# Patient Record
Sex: Female | Born: 1935 | Race: White | Hispanic: No | Marital: Married | Smoking: Never smoker
Health system: Southern US, Community
[De-identification: ages and names within clinical notes are randomized; demographics above are authoritative.]

## PROBLEM LIST (undated history)

## (undated) DIAGNOSIS — F32A Depression, unspecified: Secondary | ICD-10-CM

## (undated) DIAGNOSIS — T7840XA Allergy, unspecified, initial encounter: Secondary | ICD-10-CM

## (undated) DIAGNOSIS — R011 Cardiac murmur, unspecified: Secondary | ICD-10-CM

## (undated) DIAGNOSIS — G459 Transient cerebral ischemic attack, unspecified: Secondary | ICD-10-CM

## (undated) DIAGNOSIS — M069 Rheumatoid arthritis, unspecified: Secondary | ICD-10-CM

## (undated) DIAGNOSIS — I10 Essential (primary) hypertension: Secondary | ICD-10-CM

## (undated) DIAGNOSIS — F329 Major depressive disorder, single episode, unspecified: Secondary | ICD-10-CM

## (undated) DIAGNOSIS — I739 Peripheral vascular disease, unspecified: Secondary | ICD-10-CM

## (undated) DIAGNOSIS — D649 Anemia, unspecified: Secondary | ICD-10-CM

## (undated) HISTORY — DX: Cardiac murmur, unspecified: R01.1

## (undated) HISTORY — DX: Major depressive disorder, single episode, unspecified: F32.9

## (undated) HISTORY — PX: AMPUTATION TOE: SHX6595

## (undated) HISTORY — DX: Allergy, unspecified, initial encounter: T78.40XA

## (undated) HISTORY — PX: OTHER SURGICAL HISTORY: SHX169

## (undated) HISTORY — DX: Essential (primary) hypertension: I10

## (undated) HISTORY — PX: TOTAL KNEE ARTHROPLASTY: SHX125

## (undated) HISTORY — PX: TONSILLECTOMY: SUR1361

## (undated) HISTORY — DX: Depression, unspecified: F32.A

## (undated) HISTORY — DX: Anemia, unspecified: D64.9

---

## 2009-08-07 ENCOUNTER — Emergency Department (HOSPITAL_COMMUNITY): Admission: EM | Admit: 2009-08-07 | Discharge: 2009-08-07 | Payer: Self-pay | Admitting: Emergency Medicine

## 2011-01-22 LAB — DIFFERENTIAL
Eosinophils Absolute: 0 10*3/uL (ref 0.0–0.7)
Monocytes Absolute: 0.6 10*3/uL (ref 0.1–1.0)

## 2011-01-22 LAB — URINALYSIS, ROUTINE W REFLEX MICROSCOPIC
Bilirubin Urine: NEGATIVE
Glucose, UA: NEGATIVE mg/dL
Hgb urine dipstick: NEGATIVE
Ketones, ur: NEGATIVE mg/dL
Nitrite: NEGATIVE
Urobilinogen, UA: 1 mg/dL (ref 0.0–1.0)
pH: 7.5 (ref 5.0–8.0)

## 2011-01-22 LAB — BASIC METABOLIC PANEL
BUN: 9 mg/dL (ref 6–23)
CO2: 25 mEq/L (ref 19–32)
Calcium: 8.8 mg/dL (ref 8.4–10.5)
Chloride: 97 mEq/L (ref 96–112)
Creatinine, Ser: 0.71 mg/dL (ref 0.4–1.2)
GFR calc Af Amer: >60 mL/min (ref >60)
GFR calc non Af Amer: >60 mL/min (ref >60)

## 2011-01-22 LAB — CBC
Hemoglobin: 13.5 g/dL (ref 12.0–15.0)
Platelets: 278 10*3/uL (ref 150–400)
RDW: 15.1 % (ref 11.5–15.5)

## 2011-01-22 LAB — URINE MICROSCOPIC-ADD ON

## 2011-01-22 LAB — SEDIMENTATION RATE: Sed Rate: 11 mm/hr (ref 0–22)

## 2011-01-22 LAB — PROTIME-INR: INR: 0.94 (ref 0.00–1.49)

## 2011-04-11 IMAGING — CR DG CHEST 2V
2 series · 2 of 2 positions shown · non-contrast
Comparison: None

CLINICAL DATA: Multiple complaints no known injury

CHEST - 2 VIEW

[w chest pa]
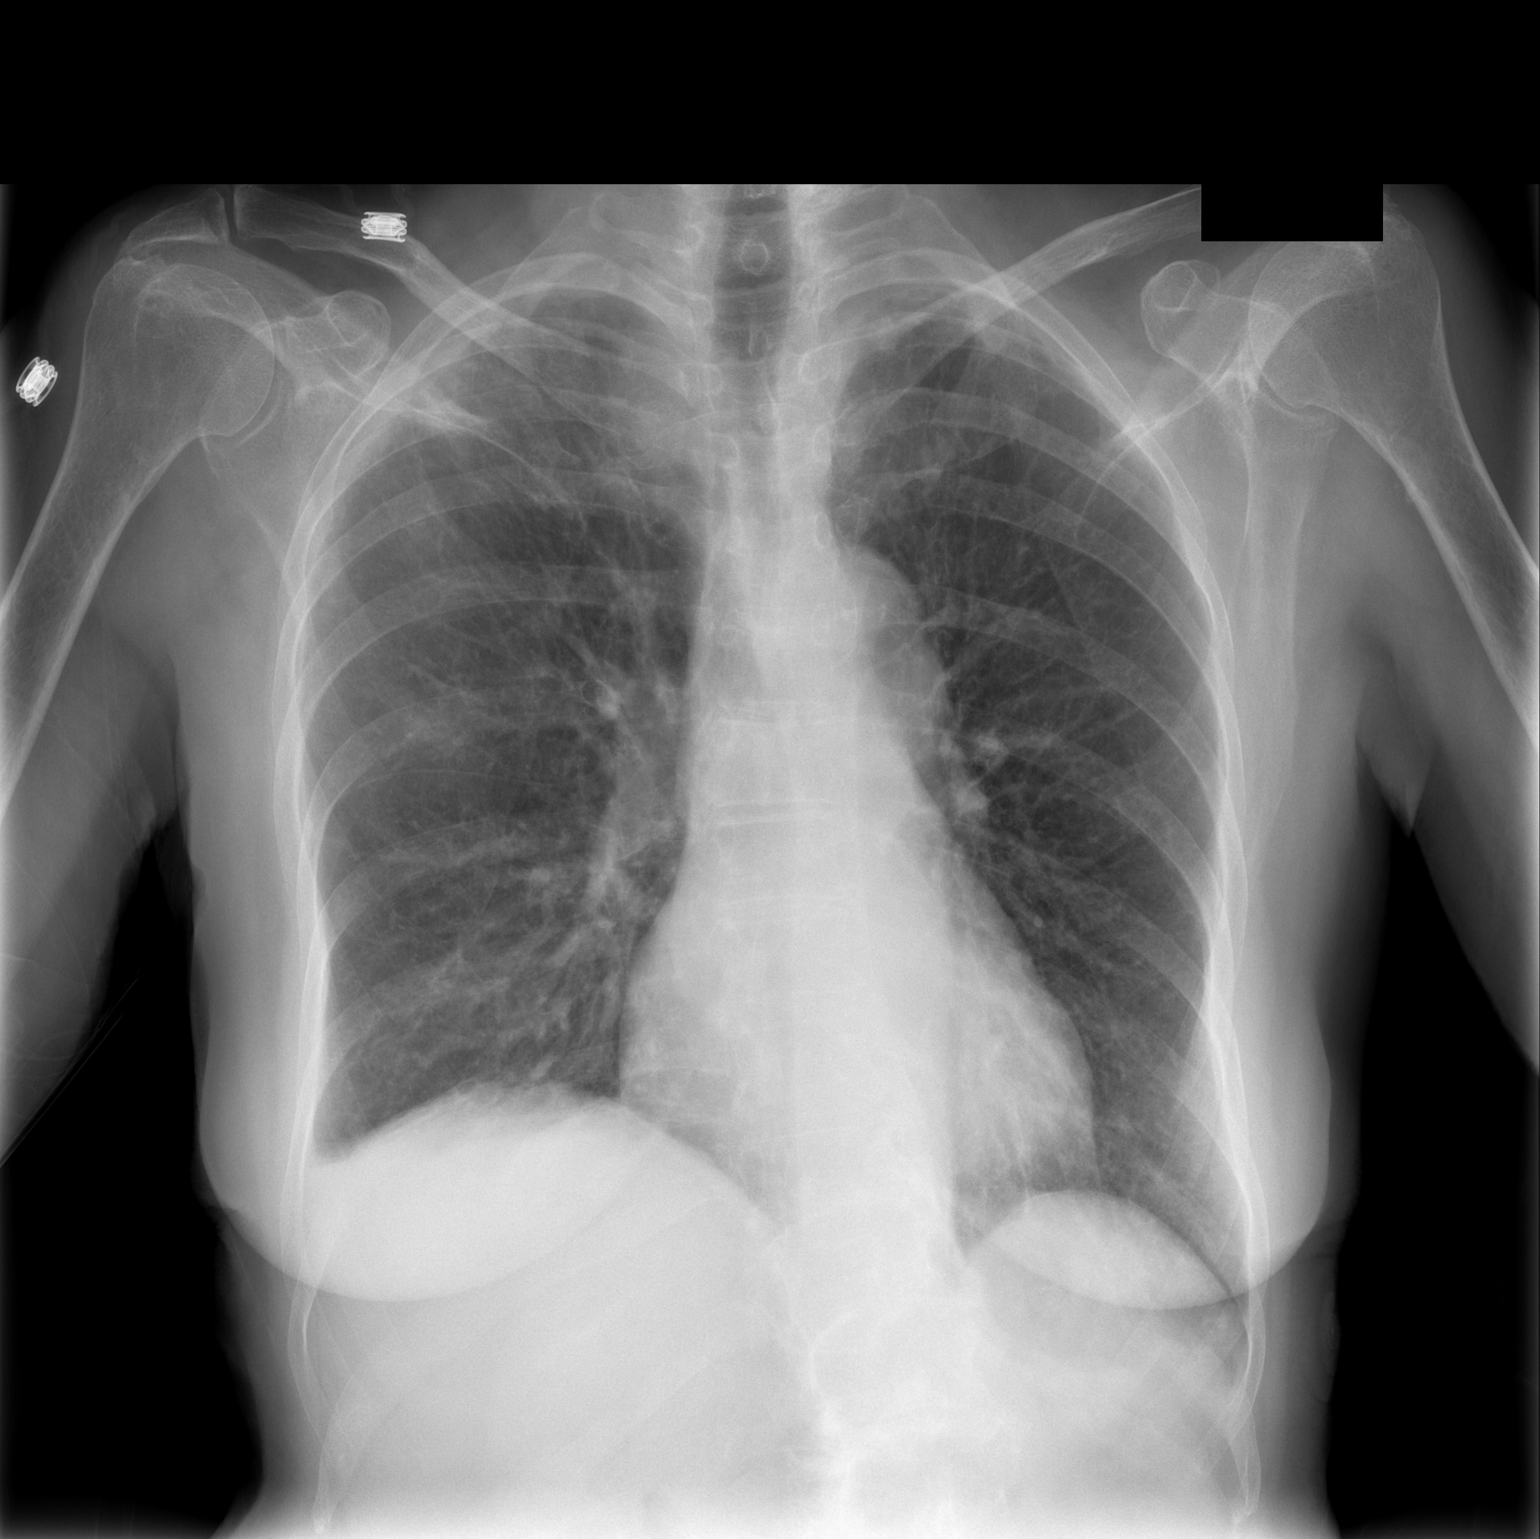

[w chest lat]
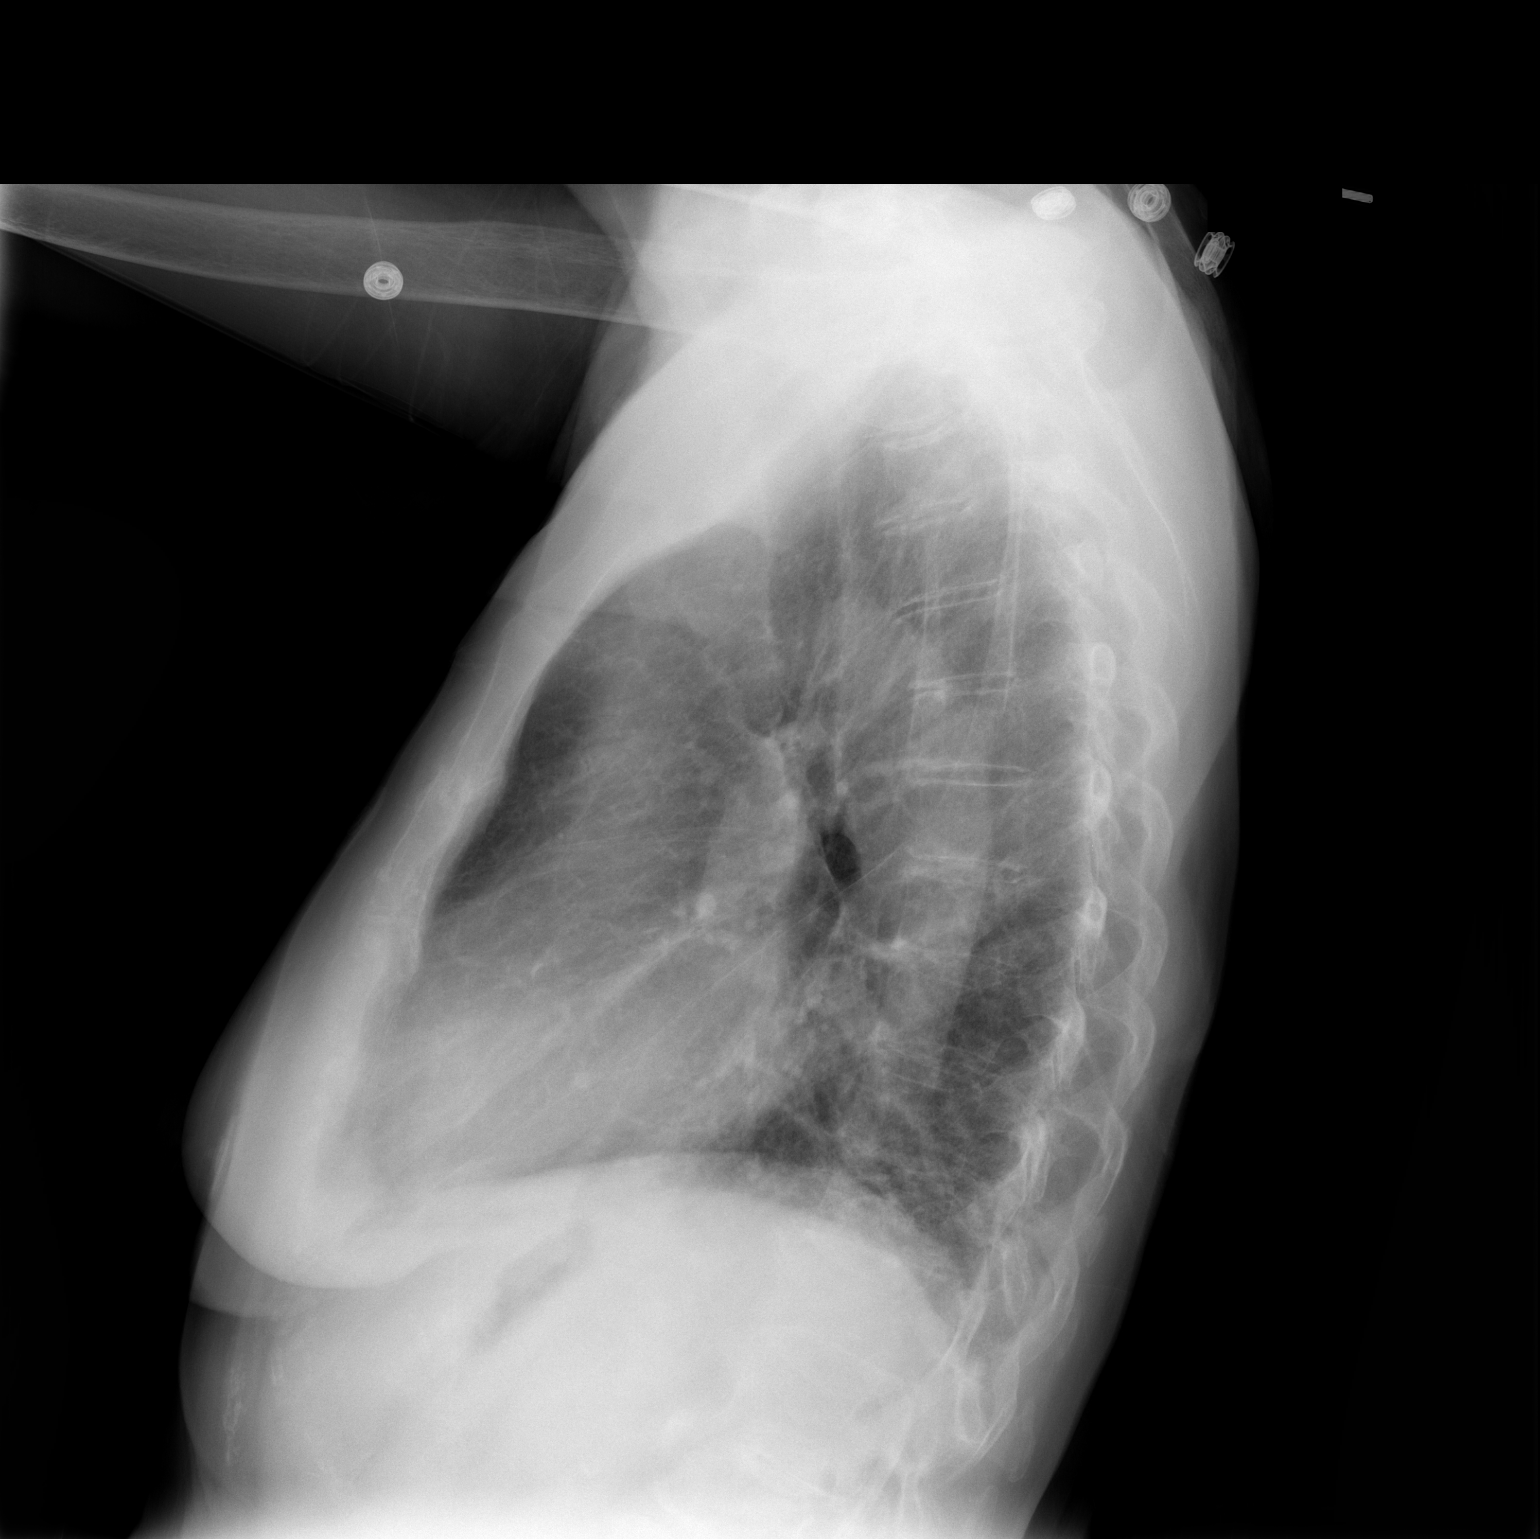

[2 of 2 positions shown; findings below may reference images not displayed]

FINDINGS: Cardiomediastinal silhouette is unremarkable.  Mild
hyperinflation.  No acute infiltrate or pleural effusion.  No
pulmonary edema.  Mild degenerative changes thoracic spine.
IMPRESSION: No active disease.  Mild hyperinflation.

## 2016-06-01 ENCOUNTER — Ambulatory Visit (INDEPENDENT_AMBULATORY_CARE_PROVIDER_SITE_OTHER): Payer: Medicare Other | Admitting: Podiatry

## 2016-06-01 DIAGNOSIS — M79675 Pain in left toe(s): Secondary | ICD-10-CM

## 2016-06-01 DIAGNOSIS — B351 Tinea unguium: Secondary | ICD-10-CM | POA: Diagnosis not present

## 2016-06-01 DIAGNOSIS — R21 Rash and other nonspecific skin eruption: Secondary | ICD-10-CM | POA: Diagnosis not present

## 2016-06-01 DIAGNOSIS — M79674 Pain in right toe(s): Secondary | ICD-10-CM

## 2016-06-01 DIAGNOSIS — L819 Disorder of pigmentation, unspecified: Secondary | ICD-10-CM | POA: Diagnosis not present

## 2016-06-01 MED ORDER — DESOXIMETASONE 0.25 % EX CREA: 1.0000 "application " | TOPICAL_CREAM | Freq: Two times a day (BID) | CUTANEOUS | 0 refills | Status: DC

## 2016-06-01 NOTE — Progress Notes (Signed)
   Subjective:    Patient ID: Cassandra Newton, female    DOB: [birth year], 82 y.o.   MRN: [id number]  HPI  82 year old female presents the office today for concerns of thick, painful, elongated which she cannot trim herself. He also assisted there is a rash on the top of the left foot and he went to urgent care and that she was prescribed topical antifungal cream. He also states today that her mom's foot turns blue and purple in color times. They also want to discuss correction of the toe deformities. No other complaints at this time.   Review of Systems  All other systems reviewed and are negative.      Objective:   Physical Exam General: AAO x3, NAD  Dermatological: Nails are hypertrophic, dystrophic, brittle, discolored, elongated 10. No surrounding redness or drainage. Tenderness to 5 bilaterally. On the dorsal aspect left foot is erythematous rash which appears similar dermatitis as opposed to of fungal infection. Antifungal not been helping. There is no itching to the rash. There are no other open lesions or pre-ulcer lesions identified.  Vascular: Dorsalis Pedis artery and Posterior Tibial artery pedal pulses are palpable bilateral with immedate capillary fill time. No significant discoloration for toes today however subjectively the toes to become purple and blue at times. There is no pain with calf compression, swelling, warmth, erythema.   Neruologic: Grossly intact via light touch bilateral. Vibratory intact via tuning fork bilateral. Protective threshold with Semmes Wienstein monofilament intact to all pedal sites bilateral.   Musculoskeletal: Hammertoe contractures are present bilaterally. Tenderness the toenails however no other areas of tenderness at this time. MMT 5/5.     Assessment & Plan:  82 year old female with symptomatic onychomycosis, hammertoes, left foot dermatitis/rash, toe discoloration -Treatment options discussed including all alternatives, risks, and  complications -Etiology of symptoms were discussed -Nails debrided 10 without complications or bleeding. -Will order arterial studies to evaluate circulation -Offloading pads were dispensed for hammertoes. Discussed with him I would not consider surgical intervention at this time until we attempted conservative treatment get other issues evaluation for circulation. They agreed to this. Discussed shoe gear modifications as well. -Order Topicort of the rash daily. -Daily foot inspection -Follow-up in 3 months or sooner if any problems arise. In the meantime, encouraged to call the office with any questions, concerns, change in symptoms.   Ovid Curd, DPM

## 2016-06-08 ENCOUNTER — Telehealth: Payer: Self-pay | Admitting: *Deleted

## 2016-06-08 DIAGNOSIS — R0989 Other specified symptoms and signs involving the circulatory and respiratory systems: Secondary | ICD-10-CM

## 2016-06-08 NOTE — Telephone Encounter (Signed)
I would continue the steroid cream for 2 weeks. Can do the steroid cream once a day that I ordered and the anti-fungal cream the other that the urgent care prescribed.

## 2016-06-08 NOTE — Telephone Encounter (Addendum)
-----   Message from Vivi Barrack, DPM sent at 06/07/2016  5:20 PM EDT ----- Can you please order arterial studies for her? 06/08/2016-Orders faxed to [facility]. 06/08/2016-Kim called states the steroid cream Dr. Ardelle Anton prescribed has not improved pt's left foot, and has the [place] been ordered for her legs and feet. I informed pt's dtr of Dr. Gabriel Rung orders.

## 2016-06-09 ENCOUNTER — Other Ambulatory Visit: Payer: Self-pay | Admitting: Podiatry

## 2016-06-09 ENCOUNTER — Ambulatory Visit: Payer: Self-pay | Admitting: Sports Medicine

## 2016-06-09 DIAGNOSIS — R0989 Other specified symptoms and signs involving the circulatory and respiratory systems: Secondary | ICD-10-CM

## 2016-06-15 ENCOUNTER — Ambulatory Visit (HOSPITAL_COMMUNITY)
Admission: RE | Admit: 2016-06-15 | Discharge: 2016-06-15 | Disposition: A | Discharge status: Home / Self Care | Payer: Medicare Other | Source: Ambulatory Visit | Attending: Podiatry | Admitting: Podiatry

## 2016-06-15 DIAGNOSIS — R0989 Other specified symptoms and signs involving the circulatory and respiratory systems: Secondary | ICD-10-CM | POA: Insufficient documentation

## 2016-06-15 DIAGNOSIS — R938 Abnormal findings on diagnostic imaging of other specified body structures: Secondary | ICD-10-CM | POA: Insufficient documentation

## 2016-06-15 DIAGNOSIS — I10 Essential (primary) hypertension: Secondary | ICD-10-CM | POA: Diagnosis not present

## 2016-06-15 DIAGNOSIS — I771 Stricture of artery: Secondary | ICD-10-CM | POA: Insufficient documentation

## 2016-06-15 DIAGNOSIS — E785 Hyperlipidemia, unspecified: Secondary | ICD-10-CM | POA: Insufficient documentation

## 2016-06-15 DIAGNOSIS — I739 Peripheral vascular disease, unspecified: Secondary | ICD-10-CM | POA: Diagnosis not present

## 2016-06-17 ENCOUNTER — Encounter: Payer: Self-pay | Admitting: Cardiovascular Disease

## 2016-06-17 ENCOUNTER — Ambulatory Visit (INDEPENDENT_AMBULATORY_CARE_PROVIDER_SITE_OTHER): Payer: Medicare Other | Admitting: Cardiovascular Disease

## 2016-06-17 DIAGNOSIS — I739 Peripheral vascular disease, unspecified: Secondary | ICD-10-CM | POA: Diagnosis not present

## 2016-06-17 DIAGNOSIS — I10 Essential (primary) hypertension: Secondary | ICD-10-CM | POA: Diagnosis not present

## 2016-06-17 DIAGNOSIS — E785 Hyperlipidemia, unspecified: Secondary | ICD-10-CM | POA: Diagnosis not present

## 2016-06-17 DIAGNOSIS — I482 Chronic atrial fibrillation, unspecified: Secondary | ICD-10-CM

## 2016-06-17 DIAGNOSIS — I480 Paroxysmal atrial fibrillation: Secondary | ICD-10-CM | POA: Insufficient documentation

## 2016-06-17 DIAGNOSIS — Z8249 Family history of ischemic heart disease and other diseases of the circulatory system: Secondary | ICD-10-CM | POA: Diagnosis not present

## 2016-06-17 NOTE — Patient Instructions (Signed)
Medication Instructions:   NO CHANGES.  Follow-Up: Your physician recommends that you schedule a follow-up appointment ON AN AS NEEDED BASIS.   If you need a refill on your cardiac medications before your next appointment, please call your pharmacy.   

## 2016-06-17 NOTE — Progress Notes (Signed)
06/17/2016 Cassandra Newton   11/19/1931  [id number]  Primary Physician Pcp Not In System Primary Cardiologist: Runell Gess MD Roseanne Reno  HPI:  Mrs. Cassandra Newton was referred by Dr. Ardelle Anton for peripheral vascular evaluation. She is accompanied by her son refill. She is an 82 year old thin and frail-appearing married Caucasian female mother of 5, grandmother 7 grandchildren who lives with her husband. She is minimally ambulatory and does walk with a walker. She has rheumatoid arthritis and is status post bilateral total knee replacements. Other problems include hypertension and hyperlipidemia. She has never smoked. She is not diabetic. Her father and brother both had myocardial infarctions. She denies claudication. There is no evidence of critical limb ischemia. Her feet are somewhat purplish in discoloration. Dr. Ardelle Anton noted that her nails were dystrophic, hypertrophic and brittle. The patient does have hammertoe contractures bilaterally. There is no evidence of skin breakdown. She had lower extremity arterial Doppler studies in our office yesterday that showed primarily tibial vessel disease. Her right ABI was 1.1 with occluded posterior tibial. Her left posterior tibial and dorsalis pedis were occluded as well with a patent peroneal artery.   Current Outpatient Prescriptions  Medication Sig Dispense Refill  . alendronate (FOSAMAX) 70 MG tablet Take by mouth.    Marland Kitchen amiodarone (PACERONE) 200 MG tablet Take by mouth.    Marland Kitchen aspirin 81 MG chewable tablet Chew by mouth.    Marland Kitchen atorvastatin (LIPITOR) 10 MG tablet Take 10 mg by mouth.    . calcium-vitamin D (CALCIUM 500/D) 500-200 MG-UNIT tablet Take by mouth.    . celecoxib (CELEBREX) 200 MG capsule Take 200 mg by mouth.    . desoximetasone (TOPICORT) 0.25 % cream Apply 1 application topically 2 (two) times daily. 30 g 0  . etanercept (ENBREL) 50 MG/ML injection Inject 50 mg into the skin.    . ferrous sulfate 325 (65 FE) MG  tablet Take by mouth.    . folic acid (FOLVITE) 1 MG tablet Take by mouth.    . hydrochlorothiazide (MICROZIDE) 12.5 MG capsule Take by mouth.    Marland Kitchen lisinopril (PRINIVIL,ZESTRIL) 20 MG tablet Take 20 mg by mouth.    . Melatonin 3 MG TABS Take 3 mg by mouth.    . methotrexate (RHEUMATREX) 2.5 MG tablet Take 15 mg by mouth.    . metoprolol (LOPRESSOR) 100 MG tablet Take 100 mg by mouth.    . metoprolol succinate (TOPROL-XL) 100 MG 24 hr tablet Take by mouth.    . ondansetron (ZOFRAN-ODT) 4 MG disintegrating tablet Take by mouth.    Marland Kitchen PARoxetine (PAXIL) 20 MG tablet Take by mouth.    . potassium chloride SA (K-DUR,KLOR-CON) 20 MEQ tablet Take by mouth.    . pramipexole (MIRAPEX) 0.5 MG tablet Take 0.5 mg by mouth.    . predniSONE (DELTASONE) 5 MG tablet TAKE ONE TABLET BY MOUTH ONCE DAILY AFTER BREAKFAST    . promethazine (PHENERGAN) 25 MG tablet Take by mouth.    . ranitidine (ZANTAC) 150 MG capsule Take 150 mg by mouth.    . rivastigmine (EXELON) 3 MG capsule     . rOPINIRole (REQUIP) 1 MG tablet Take by mouth.    . traMADol (ULTRAM) 50 MG tablet Take 50 mg by mouth.    . warfarin (COUMADIN) 2 MG tablet Take 3 mg by mouth.    . warfarin (COUMADIN) 2.5 MG tablet Take by mouth.    . zolpidem (AMBIEN) 5 MG tablet Take by mouth.  No current facility-administered medications for this visit.     No Known Allergies  Social History   Social History  . Marital status: Married    Spouse name: N/A  . Number of children: N/A  . Years of education: N/A   Occupational History  . Not on file.   Social History Main Topics  . Smoking status: Never Smoker  . Smokeless tobacco: Never Used     Comment: never smoked regularly; long time ago  . Alcohol use No  . Drug use: No  . Sexual activity: Not on file   Other Topics Concern  . Not on file   Social History Narrative  . No narrative on file     Review of Systems: General: negative for chills, fever, night sweats or weight changes.   Cardiovascular: negative for chest pain, dyspnea on exertion, edema, orthopnea, palpitations, paroxysmal nocturnal dyspnea or shortness of breath Dermatological: negative for rash Respiratory: negative for cough or wheezing Urologic: negative for hematuria Abdominal: negative for nausea, vomiting, diarrhea, bright red blood per rectum, melena, or hematemesis Neurologic: negative for visual changes, syncope, or dizziness All other systems reviewed and are otherwise negative except as noted above.    Blood pressure 140/74, pulse (!) 52, height 5\' 6"  (1.676 m), weight 119 lb 6.4 oz (54.2 kg).  General appearance: alert and no distress Neck: no adenopathy, no carotid bruit, no JVD, supple, symmetrical, trachea midline and thyroid not enlarged, symmetric, no tenderness/mass/nodules Lungs: clear to auscultation bilaterally Heart: regular rate and rhythm, S1, S2 normal, no murmur, click, rub or gallop Extremities: extremities normal, atraumatic, no cyanosis or edema  EKG not performed today  ASSESSMENT AND PLAN:   Peripheral arterial disease [facility]) Mrs. Cassandra Newton was referred by Dr. Renard Newton for peripheral vascular evaluation because of a purplish discoloration in her feet. She recently saw Dr. Ardelle Anton 06/01/16. He noted that her nails are hypertrophic, dystrophic riddled and discolored. She also had a purplish discoloration on both feet. She is minimally ambulatory and walks with a walker. She does have hammertoe contractures bilaterally. She denies claudication. There is no evidence of critical ischemia or skin breakdown. She did have Doppler studies performed in our office 06/07/16 revealed a right ABI 1.1 with occluded posterior tibial and left ABI that was not obtainable. Her posterior tibial and dorsalis pedis were occluded with only a peroneal patent to the ankle.  Essential hypertension History of hypertensive blood pressure measured 140/74. She is on hydrochlorothiazide, lisinopril and  metoprolol. Continue current meds at current dosing  Hyperlipidemia History of hyperlipidemia on statin therapy followed by her PCP  Atrial fibrillation (HCC) History of atrial fibrillation followed by her cardiologist in [facility] on Coumadin and amiodarone      [facility] MD [facility] 06/17/2016 10:01 AM

## 2016-06-17 NOTE — Assessment & Plan Note (Signed)
History of hypertensive blood pressure measured 140/74. She is on hydrochlorothiazide, lisinopril and metoprolol. Continue current meds at current dosing

## 2016-06-17 NOTE — Assessment & Plan Note (Signed)
Mrs. [name] was referred by Dr. Ardelle Anton for peripheral vascular evaluation because of a purplish discoloration in her feet. She recently saw Dr. Ardelle Anton 06/01/16. He noted that her nails are hypertrophic, dystrophic riddled and discolored. She also had a purplish discoloration on both feet. She is minimally ambulatory and walks with a walker. She does have hammertoe contractures bilaterally. She denies claudication. There is no evidence of critical ischemia or skin breakdown. She did have Doppler studies performed in our office 06/07/16 revealed a right ABI 1.1 with occluded posterior tibial and left ABI that was not obtainable. Her posterior tibial and dorsalis pedis were occluded with only a peroneal patent to the ankle.

## 2016-06-17 NOTE — Assessment & Plan Note (Signed)
History of atrial fibrillation followed by her cardiologist in [facility] on Coumadin and amiodarone

## 2016-06-17 NOTE — Assessment & Plan Note (Signed)
History of hyperlipidemia on statin therapy followed by her PCP. 

## 2016-06-18 ENCOUNTER — Telehealth: Payer: Self-pay | Admitting: *Deleted

## 2016-06-18 NOTE — Telephone Encounter (Signed)
Pt's dtr, Selena Batten would like the dopplers sent to pt's PCP, and to call for the information.  I spoke with Selena Batten and she gave me Dr. Gerlene Burdock Ambler's fax [phone]. Faxed to Dr. Garlan Fillers.

## 2016-09-04 ENCOUNTER — Ambulatory Visit: Payer: Medicare Other | Admitting: Podiatry

## 2017-04-20 ENCOUNTER — Other Ambulatory Visit: Payer: Self-pay

## 2017-04-20 ENCOUNTER — Encounter: Payer: Self-pay | Admitting: Internal Medicine

## 2017-04-20 ENCOUNTER — Non-Acute Institutional Stay (SKILLED_NURSING_FACILITY): Payer: Medicare Other | Admitting: Internal Medicine

## 2017-04-20 DIAGNOSIS — M0579 Rheumatoid arthritis with rheumatoid factor of multiple sites without organ or systems involvement: Secondary | ICD-10-CM

## 2017-04-20 DIAGNOSIS — I10 Essential (primary) hypertension: Secondary | ICD-10-CM | POA: Diagnosis not present

## 2017-04-20 DIAGNOSIS — M069 Rheumatoid arthritis, unspecified: Secondary | ICD-10-CM | POA: Insufficient documentation

## 2017-04-20 DIAGNOSIS — N390 Urinary tract infection, site not specified: Secondary | ICD-10-CM | POA: Diagnosis not present

## 2017-04-20 DIAGNOSIS — I480 Paroxysmal atrial fibrillation: Secondary | ICD-10-CM | POA: Diagnosis not present

## 2017-04-20 MED ORDER — HYDROCODONE-ACETAMINOPHEN 10-325 MG PO TABS: ORAL_TABLET | ORAL | 0 refills | Status: DC

## 2017-04-20 NOTE — Assessment & Plan Note (Signed)
Establish with Rheumatologist in [place]

## 2017-04-20 NOTE — Telephone Encounter (Signed)
Rx request from [facility]

## 2017-04-20 NOTE — Assessment & Plan Note (Addendum)
D/C hormonal therapy UA and CNS if and when symptoms recur Urology consultation

## 2017-04-20 NOTE — Progress Notes (Signed)
NURSING HOME LOCATION:  [facility] ROOM NUMBER:  [id number]  CODE STATUS:  Full Code  PCP:  System, Pcp Not In No address on file   This is a comprehensive admission note to [facility] performed on this date less than 30 days from date of admission. Included are preadmission medical/surgical history;reconciled medication list; family history; social history and comprehensive review of systems.  Corrections and additions to the records were documented . Comprehensive physical exam was also performed. Additionally a clinical summary was entered for each active diagnosis pertinent to this admission in the Problem List to enhance continuity of care.  HPI: She is a delightful 83 year old female who was residing in a SNF ([facility]) in [place]. Her husband was living independently at home but has had some memory issues. Their daughter has moved them to [place]. Although she has rheumatoid arthritis for which she takes methotrexate and prednisone her major concern is recurrent urinary tract infections. She has been placed on a vaginal hormone replacement tablet in attempt to decrease frequency of UTIs. She states this has not worked. She has not had recent gynecologic follow-up. She continues to have intermittent dysuria. She also hearing incontinence. She has not been seen by urologist.  Past medical and surgical history: Includes PAF, hypertension, dyslipidemia, osteoporosis, vitamin D deficiency, iron deficiency  anemia, and supposedly dementia. She is on warfarin anticoagulation for apparent PAF & she thinks for PFO. She saw the cardiologist Dr. Allyson Sabal in 8/30/17for PAF. Surgeries include tonsillectomy as a child, partial amputation of the fourth right finger as a child, and amputation of the third right toe as adult.  Social history: Nonsmoker and nondrinker  Family history: Mother, father, brother, and sister had heart disease. Brother had prostate cancer and sister  had an intestinal tumor. Her mother also had a stroke. There is no family history diabetes.  Review of systems: She's had occasional nosebleeds on the warfarin. She describes excessive somnolence. She describes situational depression despite high-dose Paxil.  Constitutional: No fever,significant weight change Eyes: No redness, discharge, pain, vision change ENT/mouth: No nasal congestion,  purulent discharge, earache,change in hearing ,sore throat  Cardiovascular: No chest pain, palpitations,paroxysmal nocturnal dyspnea, claudication, edema  Respiratory: No cough, sputum production,hemoptysis, DOE , significant snoring,apnea  Gastrointestinal: No heartburn,dysphagia,abdominal pain, nausea / vomiting,rectal bleeding, melena,change in bowels Genitourinary: No hematuria, pyuria, nocturia Dermatologic: No rash, pruritus, change in appearance of skin Neurologic: No dizziness,headache,syncope, seizures, numbness , tingling Endocrine: No change in hair/skin/ nails, excessive thirst, excessive hunger, excessive urination  Hematologic/lymphatic: No significant bruising, lymphadenopathy,abnormal bleeding Allergy/immunology: No itchy/ watery eyes, significant sneezing, urticaria, angioedema  Physical exam:  Pertinent or positive findings: She appears her stated age. She is on Aricept but she is oriented 3. She has slight ptosis on the left. She has an upper plate and lower partial. The left nasolabial fold slightly decreased. Systolic murmur is found at the lower left sternal border. She has dramatic rheumatoid arthritis deformities in the hands with interosseous wasting and lateral deviation of fingers. There is partial amputation of the fourth right finger distally. Thickened toenails are present. She has flexion contractures of the toes. The third right toe has been amputated. The second left toe overlaps the great toe. There is a distal shallow ulcer over the second toe area pedal pulses are decreased.  Strength is equally decreased in extremities.  General appearance:Adequately nourished; no acute distress , increased work of breathing is present.   Lymphatic: No lymphadenopathy about the head, neck,  axilla . Eyes: No conjunctival inflammation or lid edema is present. There is no scleral icterus. Ears:  External ear exam shows no significant lesions or deformities.   Nose:  External nasal examination shows no deformity or inflammation. Nasal mucosa are pink and moist without lesions ,exudates Oral exam: lips and gums are healthy appearing.There is no oropharyngeal erythema or exudate . Neck:  No thyromegaly, masses, tenderness noted.    Heart:  Normal rate and regular rhythm. S1 and S2 normal without gallop, click, rub .  Lungs:Chest clear to auscultation without wheezes, rhonchi,rales , rubs. Abdomen:Bowel sounds are normal. Abdomen is soft and nontender with no organomegaly, hernias,masses. GU: deferred  Extremities:  No cyanosis, clubbing,edema  Neurologic exam : Balance,Rhomberg,finger to nose testing could not be completed due to clinical state Skin: Warm & dry w/o tenting. No significant rash.  See clinical summary under each active problem in the Problem List with associated updated therapeutic plan

## 2017-04-20 NOTE — Assessment & Plan Note (Signed)
Due to relative hypotension, metoprolol will be decreased from 125 mg daily to 25 mg twice a day

## 2017-04-20 NOTE — Telephone Encounter (Signed)
Error, first Rx #180, should be #120, reprint Rx

## 2017-04-20 NOTE — Assessment & Plan Note (Signed)
Continue warfarin Cardiology consultation with Dr. Allyson Sabal

## 2017-04-20 NOTE — Patient Instructions (Signed)
See assessment and plan under each diagnosis in the problem list and acutely for this visit 

## 2017-04-22 LAB — BASIC METABOLIC PANEL
BUN: 20 (ref 4–21)
Creatinine: 1.1 (ref 0.5–1.1)
GLUCOSE: 89
Potassium: 5.1 (ref 3.4–5.3)
SODIUM: 132 — AB (ref 137–147)

## 2017-04-22 LAB — CBC AND DIFFERENTIAL
HEMATOCRIT: 38 (ref 36–46)
Hemoglobin: 12.1 (ref 12.0–16.0)
NEUTROS ABS: 5
PLATELETS: 356 (ref 150–399)
WBC: 8.2

## 2017-04-29 ENCOUNTER — Encounter: Payer: Self-pay | Admitting: Adult Health

## 2017-04-29 ENCOUNTER — Non-Acute Institutional Stay (SKILLED_NURSING_FACILITY): Payer: Medicare Other | Admitting: Adult Health

## 2017-04-29 DIAGNOSIS — I480 Paroxysmal atrial fibrillation: Secondary | ICD-10-CM | POA: Diagnosis not present

## 2017-04-29 DIAGNOSIS — Z7901 Long term (current) use of anticoagulants: Secondary | ICD-10-CM

## 2017-04-29 NOTE — Progress Notes (Signed)
Subjective:     Indication: atrial fibrillation Bleeding signs/symptoms: None Thromboembolic signs/symptoms: None  Missed Coumadin doses: This week - 2 due to supratherapeutic INR Medication changes: yes - currently on Amoxicillin for dental infection Dietary changes: no Bacterial/viral infection: yes - Dental infection Other concerns: no  The following portions of the patient's history were reviewed and updated as appropriate: allergies, current medications, past family history, past medical history, past social history, past surgical history and problem list.  Review of Systems A comprehensive review of systems was negative.   Objective:    INR Today: 2.7 Current dose:  Coumadin 3 mg every MWF and 2 mg every TThSS  Assessment:    Therapeutic INR for goal of 2-3   Plan:    1. New dose: Start Coumadin 2 mg 1 tab by mouth daily    2. Next INR: 05/03/17

## 2017-05-11 LAB — PROTIME-INR: Protime: 23.3 — AB (ref 10.0–13.8)

## 2017-05-11 LAB — POCT INR: INR: 2.1 — AB (ref 0.9–1.1)

## 2017-05-13 ENCOUNTER — Non-Acute Institutional Stay (SKILLED_NURSING_FACILITY): Payer: Medicare Other | Admitting: Adult Health

## 2017-05-13 ENCOUNTER — Encounter: Payer: Self-pay | Admitting: Adult Health

## 2017-05-13 DIAGNOSIS — I480 Paroxysmal atrial fibrillation: Secondary | ICD-10-CM | POA: Diagnosis not present

## 2017-05-13 DIAGNOSIS — Z7901 Long term (current) use of anticoagulants: Secondary | ICD-10-CM | POA: Diagnosis not present

## 2017-05-13 NOTE — Progress Notes (Signed)
Subjective:     Indication: atrial fibrillation Bleeding signs/symptoms: None Thromboembolic signs/symptoms: None  Missed Coumadin doses: None Medication changes: no Dietary changes: no Bacterial/viral infection: no Other concerns: no  The following portions of the patient's history were reviewed and updated as appropriate: allergies, current medications, past family history, past medical history, past social history, past surgical history and problem list.  Review of Systems A comprehensive review of systems was negative.   Objective:    INR Today: 1.3 Current dose: Coumadin 2 mg Q MWFSatSun, Coumadin 1.5 mg PO Q Tues and Thurs    Assessment:    Subtherapeutic INR for goal of 2-3   Plan:    1. New dose: Coumadin 5 mg PO X 1 today the Coumadin 2 mg PO Q D   2. Next INR: 05/17/17

## 2017-05-18 LAB — PROTIME-INR: Protime: 21 — AB (ref 10.0–13.8)

## 2017-05-18 LAB — POCT INR: INR: 1.9 — AB (ref 0.9–1.1)

## 2017-05-20 ENCOUNTER — Encounter: Payer: Self-pay | Admitting: Adult Health

## 2017-05-20 ENCOUNTER — Non-Acute Institutional Stay (SKILLED_NURSING_FACILITY): Payer: Medicare Other | Admitting: Adult Health

## 2017-05-20 DIAGNOSIS — M810 Age-related osteoporosis without current pathological fracture: Secondary | ICD-10-CM | POA: Diagnosis not present

## 2017-05-20 DIAGNOSIS — E871 Hypo-osmolality and hyponatremia: Secondary | ICD-10-CM | POA: Diagnosis not present

## 2017-05-20 DIAGNOSIS — G4700 Insomnia, unspecified: Secondary | ICD-10-CM

## 2017-05-20 DIAGNOSIS — E785 Hyperlipidemia, unspecified: Secondary | ICD-10-CM

## 2017-05-20 DIAGNOSIS — K5901 Slow transit constipation: Secondary | ICD-10-CM

## 2017-05-20 DIAGNOSIS — I480 Paroxysmal atrial fibrillation: Secondary | ICD-10-CM

## 2017-05-20 DIAGNOSIS — I10 Essential (primary) hypertension: Secondary | ICD-10-CM | POA: Diagnosis not present

## 2017-05-20 DIAGNOSIS — F339 Major depressive disorder, recurrent, unspecified: Secondary | ICD-10-CM

## 2017-05-20 DIAGNOSIS — M0579 Rheumatoid arthritis with rheumatoid factor of multiple sites without organ or systems involvement: Secondary | ICD-10-CM | POA: Diagnosis not present

## 2017-05-20 NOTE — Progress Notes (Signed)
DATE:  05/20/2017   MRN:  [id number]  BIRTHDAY: 09-13-1932  Facility:  Nursing Home Location:  [facility] Room Number: 202-A  LEVEL OF CARE:  SNF (31)  Contact Information    Name Relation Home Work Mobile   Harris,Kim Daughter   [phone]   Tejah, Brekke   [phone]       Code Status History    This patient does not have a recorded code status. Please follow your organizational policy for patients in this situation.       Chief Complaint  Patient presents with  . Medical Management of Chronic Issues    Routine visit    HISTORY OF PRESENT ILLNESS:  This is an 45-YO female seen for a routine visit.  She is a long-term care resident of [facility].  She has a PMH of dementia, essential HTN, HLD, PAD, A. Fib, and family history of heart disease.  She was seen in the room today with husband (who is also a resident of [facility]) at bedside. She verbalized being slightly constipated. She has just finished PT and OT this morning. She continues to take Coumadin and no noted bleeding nor bruising.     PAST MEDICAL HISTORY:  Past Medical History:  Diagnosis Date  . Allergy    Remicade caused urticaria  . Anemia    On iron supplement  . Depression    Paxil  . Heart murmur    History of PFO  . Hypertension      CURRENT MEDICATIONS: Reviewed  Patient's Medications  New Prescriptions   No medications on file  Previous Medications   ALENDRONATE (FOSAMAX) 70 MG TABLET    Take 70 mg by mouth once a week. On Mondays   ATORVASTATIN (LIPITOR) 10 MG TABLET    Take 10 mg by mouth daily at 6 PM.    CALCIUM CARBONATE-VITAMIN D3 (CALCIUM 600/VITAMIN D) 600-400 MG-UNIT TABS    Take 1 tablet by mouth 2 (two) times daily.   CELECOXIB (CELEBREX) 200 MG CAPSULE    Take 200 mg by mouth daily.    CHOLECALCIFEROL 1000 UNITS TABLET    Take 1,000 Units by mouth daily.   CICLOPIROX (LOPROX) 0.77 % CREAM     Apply 1 application topically every evening.   CYANOCOBALAMIN (B-12) 1000 MCG/ML KIT    Inject 1,000 mcg as directed every 30 (thirty) days.   DICLOFENAC SODIUM (VOLTAREN) 1 % GEL    Apply 2 g topically every 6 (six) hours as needed (Joint pain).   DOCOSANOL 10 % CREA    Apply 1 application topically every 6 (six) hours as needed (Dry lips).   DONEPEZIL (ARICEPT) 5 MG TABLET    Take 5 mg by mouth at bedtime.   FERROUS SULFATE 325 (65 FE) MG TABLET    Take 325 mg by mouth daily with breakfast.    FOLIC ACID (FOLVITE) 1 MG TABLET    Take 1 mg by mouth daily.    LISINOPRIL (PRINIVIL,ZESTRIL) 20 MG TABLET    Take 20 mg by mouth daily.    MELATONIN 10 MG TABS    Take 10 mg by mouth.   METHOTREXATE (RHEUMATREX) 15 MG TABLET    Take 15 mg by mouth once a week. Caution: Chemotherapy. Protect from light.   METOPROLOL TARTRATE (LOPRESSOR) 25 MG TABLET    Take 25 mg by mouth 2 (two) times daily.   MULTIPLE VITAMINS-MINERALS (MULTIVITAMIN WITH MINERALS) TABLET  Take 1 tablet by mouth daily.   NYSTATIN (MYCOSTATIN) 100000 UNIT/ML SUSPENSION    Take 5 mLs by mouth 4 (four) times daily. Swish and spit ac for yeast   ONDANSETRON (ZOFRAN-ODT) 4 MG DISINTEGRATING TABLET    Take 4 mg by mouth every 12 (twelve) hours as needed for nausea or vomiting.    PAROXETINE (PAXIL) 40 MG TABLET    Take 40 mg by mouth every morning.   PREDNISONE (DELTASONE) 5 MG TABLET    TAKE ONE TABLET BY MOUTH ONCE DAILY AFTER BREAKFAST   SODIUM CHLORIDE 1 G TABLET    Take 1 g by mouth once a week. On Sundays   WARFARIN SODIUM (COUMADIN PO)    Take 2.5 mg by mouth daily.  Modified Medications   No medications on file  Discontinued Medications   No medications on file     Allergies  Allergen Reactions  . Remicade [Infliximab]      REVIEW OF SYSTEMS:  GENERAL: no change in appetite, no fatigue, no weight changes, no fever, chills or weakness EYES: Denies change in vision, dry eyes, eye pain, itching or discharge EARS: Denies  change in hearing, ringing in ears, or earache NOSE: Denies nasal congestion or epistaxis MOUTH and THROAT: Denies oral discomfort, gingival pain or bleeding, pain from teeth or hoarseness   RESPIRATORY: no cough, SOB, DOE, wheezing, hemoptysis CARDIAC: no chest pain, edema or palpitations GI: no abdominal pain, diarrhea, heart burn, nausea or vomiting, +constipation GU: Denies dysuria, frequency, hematuria, incontinence, or discharge PSYCHIATRIC: Denies feeling of depression or anxiety. No report of hallucinations, insomnia, paranoia, or agitation    PHYSICAL EXAMINATION  GENERAL APPEARANCE: Well nourished. In no acute distress. Normal body habitus SKIN:  Skin is warm and dry.  HEAD: Normal in size and contour. No evidence of trauma EYES: Lids open and close normally. No blepharitis, entropion or ectropion. PERRL. Conjunctivae are clear and sclerae are white. Lenses are without opacity EARS: Pinnae are normal. Patient hears normal voice tunes of the examiner MOUTH and THROAT: Lips are without lesions. Oral mucosa is moist and without lesions. Tongue is normal in shape, size, and color and without lesions RESPIRATORY: breathing is even & unlabored, BS CTAB CARDIAC: RRR, no murmur,no extra heart sounds, no edema GI: abdomen soft, normal BS, no masses, no tenderness, no hepatomegaly, no splenomegaly EXTREMITIES:  Able to move X 4 extremities, bilateral hands with arthritic deformities PSYCHIATRIC: Alert and oriented X 3. Affect and behavior are appropriate   LABS/RADIOLOGY: Labs reviewed: Basic Metabolic Panel:  Recent Labs  04/22/17  NA 132*  K 5.1  BUN 20  CREATININE 1.1   CBC:  Recent Labs  04/22/17  WBC 8.2  NEUTROABS 5  HGB 12.1  HCT 38  PLT 356    ASSESSMENT/PLAN:  1. Rheumatoid arthritis involving multiple sites with positive rheumatoid factor (HCC) - stable; Continue prednisone 5 mg 1 tab daily, Celebrex 200 mg 1 capsule daily, methotrexate 15 mg 1 tab every  Monday and diclofenac sodium 1% gel topically 2 g every 6 hours when necessary   2. Essential hypertension - well-controlled; continue lisinopril 20 mg 1 tab daily and metoprolol tartrate 25 mg 1 tab by mouth twice a day   3. Recurrent major depressive disorder, remission status unspecified (HCC) - mood is stable; continue paroxetine 40 mg 1 tab daily   4. Hyponatremia - Na 132, continue sodium chloride 1 g on Sundays   5. Osteoporosis, unspecified osteoporosis type, unspecified pathological fracture presence -  Continue alendronate sodium 70 mg 1 tab every Mondays and calcium with vitamin D 600-400 mg 1 tab twice a day  6. Insomnia, unspecified type - continue melatonin 10 mg 1 tab daily at bedtime   7. PAF (paroxysmal atrial fibrillation) (HCC) - rate controlled; continue Coumadin, metoprolol tartrate 25 mg 1 tab twice a day   8. Hyperlipidemia, unspecified hyperlipidemia type - continue atorvastatin 10 mg 1 tab daily at bedtime   9. Slow transit constipation - start Colace 100 mg 1 capsule twice a day      Goals of care:  Long-term care    Monina C. Salcha - NP    [employer] [phone]

## 2017-05-21 ENCOUNTER — Ambulatory Visit (INDEPENDENT_AMBULATORY_CARE_PROVIDER_SITE_OTHER): Payer: Medicare Other | Admitting: Cardiovascular Disease

## 2017-05-21 ENCOUNTER — Encounter: Payer: Self-pay | Admitting: Cardiovascular Disease

## 2017-05-21 VITALS — BP 110/80 | HR 63 | Ht 66.0 in | Wt 140.0 lb

## 2017-05-21 DIAGNOSIS — I10 Essential (primary) hypertension: Secondary | ICD-10-CM | POA: Diagnosis not present

## 2017-05-21 DIAGNOSIS — I480 Paroxysmal atrial fibrillation: Secondary | ICD-10-CM

## 2017-05-21 DIAGNOSIS — E7800 Pure hypercholesterolemia, unspecified: Secondary | ICD-10-CM

## 2017-05-21 DIAGNOSIS — I739 Peripheral vascular disease, unspecified: Secondary | ICD-10-CM

## 2017-05-21 NOTE — Assessment & Plan Note (Signed)
History of essential hypertension blood pressure measured today at 110/80. She is on lisinopril and metoprolol. Continue current meds at current dosing

## 2017-05-21 NOTE — Progress Notes (Signed)
05/21/2017 Marius Ditch   January 21, 1932  [id number]  Primary Physician System, Pcp Not In Primary Cardiologist: Lorretta Harp MD Lupe Carney, [place]  HPI:  Cassandra Newton is a 83 y.o. female    referred by Dr. Jacqualyn Posey for peripheral vascular evaluation. She is accompanied by her daughter today.. She is an 83 year old thin and frail-appearing married Caucasian female mother of 48, grandmother 7 grandchildren who lives with her husband in a nursing home.. She is minimally ambulatory and does walk with a walker. She has rheumatoid arthritis and is status post bilateral total knee replacements. Other problems include hypertension and hyperlipidemia. She has never smoked. She is not diabetic. Her father and brother both had myocardial infarctions. She denies claudication. There is no evidence of critical limb ischemia. Her feet are somewhat purplish in discoloration. Dr. Jacqualyn Posey noted that her nails were dystrophic, hypertrophic and brittle. The patient does have hammertoe contractures bilaterally. There is no evidence of skin breakdown. She had lower extremity arterial Doppler studies in our office yesterday that showed primarily tibial vessel disease. Her right ABI was 1.1 with occluded posterior tibial. Her left posterior tibial and dorsalis pedis were occluded as well with a patent peroneal artery. Since I saw her one year ago she has remained clinically stable. She's had no issues with her PAD.   Current Meds  Medication Sig  . alendronate (FOSAMAX) 70 MG tablet Take 70 mg by mouth once a week. On Mondays  . atorvastatin (LIPITOR) 10 MG tablet Take 10 mg by mouth daily at 6 PM.   . Calcium Carbonate-Vitamin D3 (CALCIUM 600/VITAMIN D) 600-400 MG-UNIT TABS Take 1 tablet by mouth 2 (two) times daily.  . celecoxib (CELEBREX) 200 MG capsule Take 200 mg by mouth daily.   . Cholecalciferol 1000 units tablet Take 1,000 Units by mouth daily.  . ciclopirox (LOPROX) 0.77 % cream Apply 1  application topically every evening.  . Cyanocobalamin (B-12) 1000 MCG/ML KIT Inject 1,000 mcg as directed every 30 (thirty) days.  . diclofenac sodium (VOLTAREN) 1 % GEL Apply 2 g topically every 6 (six) hours as needed (Joint pain).  . Docosanol 10 % CREA Apply 1 application topically every 6 (six) hours as needed (Dry lips).  . docusate sodium (COLACE) 100 MG capsule Take 100 mg by mouth 2 (two) times daily.  Marland Kitchen donepezil (ARICEPT) 5 MG tablet Take 5 mg by mouth at bedtime.  . ferrous sulfate 325 (65 FE) MG tablet Take 325 mg by mouth daily with breakfast.   . folic acid (FOLVITE) 1 MG tablet Take 1 mg by mouth daily.   Marland Kitchen lisinopril (PRINIVIL,ZESTRIL) 20 MG tablet Take 20 mg by mouth daily.   . Melatonin 10 MG TABS Take 10 mg by mouth.  . methotrexate (RHEUMATREX) 15 MG tablet Take 15 mg by mouth once a week. Caution: Chemotherapy. Protect from light.  . metoprolol tartrate (LOPRESSOR) 25 MG tablet Take 25 mg by mouth 2 (two) times daily.  . Multiple Vitamins-Minerals (MULTIVITAMIN WITH MINERALS) tablet Take 1 tablet by mouth daily.  Marland Kitchen nystatin (MYCOSTATIN) 100000 UNIT/ML suspension Take 5 mLs by mouth 4 (four) times daily. Swish and spit ac for yeast  . ondansetron (ZOFRAN-ODT) 4 MG disintegrating tablet Take 4 mg by mouth every 12 (twelve) hours as needed for nausea or vomiting.   Marland Kitchen PARoxetine (PAXIL) 40 MG tablet Take 40 mg by mouth every morning.  . predniSONE (DELTASONE) 5 MG tablet TAKE ONE TABLET BY MOUTH ONCE DAILY AFTER BREAKFAST  .  sodium chloride 1 g tablet Take 1 g by mouth once a week. On Sundays  . Warfarin Sodium (COUMADIN PO) Take 2.5 mg by mouth daily.     Allergies  Allergen Reactions  . Remicade [Infliximab]     Social History   Social History  . Marital status: Married    Spouse name: N/A  . Number of children: N/A  . Years of education: N/A   Occupational History  . Not on file.   Social History Main Topics  . Smoking status: Never Smoker  . Smokeless  tobacco: Never Used     Comment: never smoked regularly; long time ago  . Alcohol use No  . Drug use: No  . Sexual activity: Not on file   Other Topics Concern  . Not on file   Social History Narrative  . No narrative on file     Review of Systems: General: negative for chills, fever, night sweats or weight changes.  Cardiovascular: negative for chest pain, dyspnea on exertion, edema, orthopnea, palpitations, paroxysmal nocturnal dyspnea or shortness of breath Dermatological: negative for rash Respiratory: negative for cough or wheezing Urologic: negative for hematuria Abdominal: negative for nausea, vomiting, diarrhea, bright red blood per rectum, melena, or hematemesis Neurologic: negative for visual changes, syncope, or dizziness All other systems reviewed and are otherwise negative except as noted above.    Blood pressure 110/80, pulse 63, height _0  (1.676 m), weight 140 lb (63.5 kg).  General appearance: alert and no distress Neck: no adenopathy, no carotid bruit, no JVD, supple, symmetrical, trachea midline and thyroid not enlarged, symmetric, no tenderness/mass/nodules Lungs: clear to auscultation bilaterally Heart: regular rate and rhythm, S1, S2 normal, no murmur, click, rub or gallop Extremities: extremities normal, atraumatic, no cyanosis or edema  EKG normal sinus rhythm at 63 with left bundle branch block. I personally reviewed this EKG.  ASSESSMENT AND PLAN:   Essential hypertension History of essential hypertension blood pressure measured today at 110/80. She is on lisinopril and metoprolol. Continue current meds at current dosing  Hyperlipidemia History of hyperlipidemia on statin therapy followed by her PCP  Peripheral arterial disease ([place]) History of peripheral arterial disease with Dopplers performed 06/07/16 revealing a right ABI of 1.14 and a left was not compressible with tibial vessel disease. She has no evidence of critical limb ischemia. She  walks minimally with the walker during physical therapy.  PAF (paroxysmal atrial fibrillation) (HCC) History of paroxysmal atrial fibrillation on Coumadin anticoagulation maintaining sinus rhythm      Lorretta Harp MD [facility], [facility] 05/21/2017 9:46 AM

## 2017-05-21 NOTE — Patient Instructions (Signed)
Medication Instructions: Your physician recommends that you continue on your current medications as directed. Please refer to the Current Medication list given to you today.   Follow-Up: Your physician recommends that you schedule a follow-up appointment as needed with Dr. Berry.    

## 2017-05-21 NOTE — Assessment & Plan Note (Signed)
History of hyperlipidemia on statin therapy followed by her PCP. 

## 2017-05-21 NOTE — Assessment & Plan Note (Signed)
History of peripheral arterial disease with Dopplers performed 06/07/16 revealing a right ABI of 1.14 and a left was not compressible with tibial vessel disease. She has no evidence of critical limb ischemia. She walks minimally with the walker during physical therapy.

## 2017-05-21 NOTE — Assessment & Plan Note (Signed)
History of paroxysmal atrial fibrillation on Coumadin anticoagulation maintaining sinus rhythm

## 2017-05-25 LAB — POCT INR: INR: 4.8 — AB (ref 0.9–1.1)

## 2017-05-25 LAB — PROTIME-INR: Protime: 43.3 — AB (ref 10.0–13.8)

## 2017-05-26 LAB — POCT INR: INR: 3.7 — AB (ref 0.9–1.1)

## 2017-05-26 LAB — PROTIME-INR: Protime: 35.4 — AB (ref 10.0–13.8)

## 2017-05-27 LAB — PROTIME-INR: PROTIME: 29.5 — AB (ref 10.0–13.8)

## 2017-05-27 LAB — POCT INR: INR: 2.9 — AB (ref 0.9–1.1)

## 2017-06-22 ENCOUNTER — Non-Acute Institutional Stay (SKILLED_NURSING_FACILITY): Payer: Medicare Other

## 2017-06-22 DIAGNOSIS — Z0000 Encounter for general adult medical examination without abnormal findings: Secondary | ICD-10-CM | POA: Diagnosis not present

## 2017-06-22 LAB — PROTIME-INR: Protime: 20.5 — AB (ref 10.0–13.8)

## 2017-06-22 LAB — POCT INR: INR: 1.8 — AB (ref 0.9–1.1)

## 2017-06-22 NOTE — Progress Notes (Signed)
Subjective:   Cassandra Newton is a 83 y.o. female who presents for an Initial Medicare Annual Wellness Visit at [facility]    Objective:    Today's Vitals   06/22/17 1403  BP: 110/60  Pulse: 76  Temp: 98.1 F (36.7 C)  TempSrc: Oral  SpO2: 94%  Weight: 140 lb (63.5 kg)  Height: _0  (1.676 m)   Body mass index is 22.6 kg/m.   Current Medications (verified) Outpatient Encounter Prescriptions as of 06/22/2017  Medication Sig  . alendronate (FOSAMAX) 70 MG tablet Take 70 mg by mouth once a week. On Mondays  . atorvastatin (LIPITOR) 10 MG tablet Take 10 mg by mouth daily at 6 PM.   . Calcium Carbonate-Vitamin D3 (CALCIUM 600/VITAMIN D) 600-400 MG-UNIT TABS Take 1 tablet by mouth 2 (two) times daily.  . celecoxib (CELEBREX) 200 MG capsule Take 200 mg by mouth daily.   . ciclopirox (LOPROX) 0.77 % cream Apply 1 application topically every evening.  . Cyanocobalamin (B-12) 1000 MCG/ML KIT Inject 1,000 mcg as directed every 30 (thirty) days.  . diclofenac sodium (VOLTAREN) 1 % GEL Apply 2 g topically every 6 (six) hours as needed (Joint pain).  . Docosanol 10 % CREA Apply 1 application topically every 6 (six) hours as needed (Dry lips).  . docusate sodium (COLACE) 100 MG capsule Take 100 mg by mouth 2 (two) times daily.  Marland Kitchen donepezil (ARICEPT) 5 MG tablet Take 5 mg by mouth at bedtime.  . folic acid (FOLVITE) 1 MG tablet Take 1 mg by mouth daily.   Marland Kitchen lisinopril (PRINIVIL,ZESTRIL) 20 MG tablet Take 20 mg by mouth daily.   . Melatonin 10 MG TABS Take 10 mg by mouth.  . methotrexate (RHEUMATREX) 15 MG tablet Take 15 mg by mouth once a week. Caution: Chemotherapy. Protect from light.  . metoprolol tartrate (LOPRESSOR) 25 MG tablet Take 25 mg by mouth 2 (two) times daily.  . Multiple Vitamins-Minerals (MULTIVITAMIN WITH MINERALS) tablet Take 1 tablet by mouth daily.  . ondansetron (ZOFRAN-ODT) 4 MG disintegrating tablet Take 4 mg by mouth every 12 (twelve) hours as needed  for nausea or vomiting.   Marland Kitchen PARoxetine (PAXIL) 40 MG tablet Take 40 mg by mouth every morning.  . predniSONE (DELTASONE) 5 MG tablet TAKE ONE TABLET BY MOUTH ONCE DAILY AFTER BREAKFAST  . sodium chloride 1 g tablet Take 1 g by mouth once a week. On Sundays  . Warfarin Sodium (COUMADIN PO) Take 2.5 mg by mouth daily.  . Cholecalciferol 1000 units tablet Take 1,000 Units by mouth daily.  . [DISCONTINUED] ferrous sulfate 325 (65 FE) MG tablet Take 325 mg by mouth daily with breakfast.    No facility-administered encounter medications on file as of 06/22/2017.     Allergies (verified) Remicade [infliximab]   History: Past Medical History:  Diagnosis Date  . Allergy    Remicade caused urticaria  . Anemia    On iron supplement  . Depression    Paxil  . Heart murmur    History of PFO  . Hypertension    Past Surgical History:  Procedure Laterality Date  . AMPUTATION TOE Right    Third right toe  . cataract surgery Bilateral   . Partial amputation finger Right    4th finger partially amputated as a child  . TONSILLECTOMY    . TOTAL KNEE ARTHROPLASTY Bilateral    Family History  Problem Relation Age of Onset  . Heart disease Mother   .  Stroke Mother   . Heart disease Father   . Heart disease Sister   . Cancer Sister   . Heart disease Brother   . Cancer Brother   . Diabetes Neg Hx    Social History   Occupational History  . Not on file.   Social History Main Topics  . Smoking status: Never Smoker  . Smokeless tobacco: Never Used     Comment: never smoked regularly; long time ago  . Alcohol use No  . Drug use: No  . Sexual activity: Not on file    Tobacco Counseling Counseling given: Not Answered   Activities of Daily Living In your present state of health, do you have any difficulty performing the following activities: 06/22/2017  Hearing? N  Vision? N  Difficulty concentrating or making decisions? Y  Walking or climbing stairs? Y  Dressing or bathing? Y    Doing errands, shopping? Y  Preparing Food and eating ? Y  Using the Toilet? Y  In the past six months, have you accidently leaked urine? Y  Do you have problems with loss of bowel control? N  Managing your Medications? Y  Managing your Finances? Y  Housekeeping or managing your Housekeeping? Y  Some recent data might be hidden    Immunizations and Health Maintenance Immunization History  Administered Date(s) Administered  . PPD Test 04/20/2017  . Pneumococcal-Unspecified 10/25/1998   Health Maintenance Due  Topic Date Due  . PNA vac Low Risk Adult (2 of 2 - PCV13) 10/26/1999  . INFLUENZA VACCINE  05/19/2017    Patient Care Team: System, Pcp Not In as PCP - General  Indicate any recent Medical Services you may have received from other than [facility] providers in the past year (date may be approximate).     Assessment:   This is a routine wellness examination for [place].   Hearing/Vision screen No exam data present  Dietary issues and exercise activities discussed: Current Exercise Habits: Structured exercise class, Type of exercise: Other - see comments (physical therpay), Time (Minutes): 30, Frequency (Times/Week): 7, Weekly Exercise (Minutes/Week): 210, Intensity: Mild  Goals    None     Depression Screen PHQ 2/9 Scores 06/22/2017  PHQ - 2 Score 1    Fall Risk Fall Risk  06/22/2017  Falls in the past year? No    Cognitive Function:     6CIT Screen 06/22/2017  What Year? 0 points  What month? 0 points  What time? 0 points  Count back from 20 0 points  Months in reverse 2 points  Repeat phrase 6 points  Total Score 8    Screening Tests Health Maintenance  Topic Date Due  . PNA vac Low Risk Adult (2 of 2 - PCV13) 10/26/1999  . INFLUENZA VACCINE  05/19/2017  . DEXA SCAN  05/19/2018 (Originally 10/25/1998)  . TETANUS/TDAP  05/20/2027 (Originally 10/25/1950)      Plan:  I have personally reviewed and addressed the Medicare Annual Wellness questionnaire  and have noted the following in the patient's chart:  A. Medical and social history B. Use of alcohol, tobacco or illicit drugs  C. Current medications and supplements D. Functional ability and status E.  Nutritional status F.  Physical activity G. Advance directives H. List of other physicians I.  Hospitalizations, surgeries, and ER visits in previous 12 months J.  [place] to include hearing, vision, cognitive, depression L. Referrals and appointments - none  In addition, I have reviewed and discussed with patient  certain preventive protocols, quality metrics, and best practice recommendations. A written personalized care plan for preventive services as well as general preventive health recommendations were provided to patient.  See attached scanned questionnaire for additional information.   Signed,   Rich Reining, RN Nurse Health Advisor   Quick Notes   Health Maintenance: PNA 13, DEXA, TDAP due. Flu vaccine due when facility get it in stock.     Abnormal Screen: 6 CIT-8     Patient Concerns: none     Nurse Concerns: none I have personally reviewed the health advisor's clinical note, was available for consultation, and agree with the assessment and plan as written. Hendricks Limes M.D., FACP, [facility]

## 2017-06-22 NOTE — Patient Instructions (Signed)
[name] , Thank you for taking time to come for your Medicare Wellness Visit. I appreciate your ongoing commitment to your health goals. Please review the following plan we discussed and let me know if I can assist you in the future.   Screening recommendations/referrals: Colonoscopy excluded, pt over age 81 Mammogram excluded, pt over age 16 Bone Density due Recommended yearly ophthalmology/optometry visit for glaucoma screening and checkup Recommended yearly dental visit for hygiene and checkup  Vaccinations: Influenza vaccine due Pneumococcal vaccine 13 due Tdap vaccine due Shingles vaccine not in records  Advanced directives: Need a copy for chart  Conditions/risks identified: None  Next appointment: Dr. Alwyn Newton makes rounds   Preventive Care 65 Years and Older, Female Preventive care refers to lifestyle choices and visits with your health care provider that can promote health and wellness. What does preventive care include?  A yearly physical exam. This is also called an annual well check.  Dental exams once or twice a year.  Routine eye exams. Ask your health care provider how often you should have your eyes checked.  Personal lifestyle choices, including:  Daily care of your teeth and gums.  Regular physical activity.  Eating a healthy diet.  Avoiding tobacco and drug use.  Limiting alcohol use.  Practicing safe sex.  Taking low-dose aspirin every day.  Taking vitamin and mineral supplements as recommended by your health care provider. What happens during an annual well check? The services and screenings done by your health care provider during your annual well check will depend on your age, overall health, lifestyle risk factors, and family history of disease. Counseling  Your health care provider may ask you questions about your:  Alcohol use.  Tobacco use.  Drug use.  Emotional well-being.  Home and relationship well-being.  Sexual  activity.  Eating habits.  History of falls.  Memory and ability to understand (cognition).  Work and work Astronomer.  Reproductive health. Screening  You may have the following tests or measurements:  Height, weight, and BMI.  Blood pressure.  Lipid and cholesterol levels. These may be checked every 5 years, or more frequently if you are over 49 years old.  Skin check.  Lung cancer screening. You may have this screening every year starting at age 15 if you have a 30-pack-year history of smoking and currently smoke or have quit within the past 15 years.  Fecal occult blood test (FOBT) of the stool. You may have this test every year starting at age 22.  Flexible sigmoidoscopy or colonoscopy. You may have a sigmoidoscopy every 5 years or a colonoscopy every 10 years starting at age 9.  Hepatitis C blood test.  Hepatitis B blood test.  Sexually transmitted disease (STD) testing.  Diabetes screening. This is done by checking your blood sugar (glucose) after you have not eaten for a while (fasting). You may have this done every 1-3 years.  Bone density scan. This is done to screen for osteoporosis. You may have this done starting at age 21.  Mammogram. This may be done every 1-2 years. Talk to your health care provider about how often you should have regular mammograms. Talk with your health care provider about your test results, treatment options, and if necessary, the need for more tests. Vaccines  Your health care provider may recommend certain vaccines, such as:  Influenza vaccine. This is recommended every year.  Tetanus, diphtheria, and acellular pertussis (Tdap, Td) vaccine. You may need a Td booster every 10 years.  Zoster vaccine. You may need this after age 64.  Pneumococcal 13-valent conjugate (PCV13) vaccine. One dose is recommended after age 59.  Pneumococcal polysaccharide (PPSV23) vaccine. One dose is recommended after age 55. Talk to your health care  provider about which screenings and vaccines you need and how often you need them. This information is not intended to replace advice given to you by your health care provider. Make sure you discuss any questions you have with your health care provider. Document Released: 11/01/2015 Document Revised: 06/24/2016 Document Reviewed: 08/06/2015 Elsevier Interactive Patient Education  2017 [place] Prevention in the Home Falls can cause injuries. They can happen to people of all ages. There are many things you can do to make your home safe and to help prevent falls. What can I do on the outside of my home?  Regularly fix the edges of walkways and driveways and fix any cracks.  Remove anything that might make you trip as you walk through a door, such as a raised step or threshold.  Trim any bushes or trees on the path to your home.  Use bright outdoor lighting.  Clear any walking paths of anything that might make someone trip, such as rocks or tools.  Regularly check to see if handrails are loose or broken. Make sure that both sides of any steps have handrails.  Any raised decks and porches should have guardrails on the edges.  Have any leaves, snow, or ice cleared regularly.  Use sand or salt on walking paths during winter.  Clean up any spills in your garage right away. This includes oil or grease spills. What can I do in the bathroom?  Use night lights.  Install grab bars by the toilet and in the tub and shower. Do not use towel bars as grab bars.  Use non-skid mats or decals in the tub or shower.  If you need to sit down in the shower, use a plastic, non-slip stool.  Keep the floor dry. Clean up any water that spills on the floor as soon as it happens.  Remove soap buildup in the tub or shower regularly.  Attach bath mats securely with double-sided non-slip rug tape.  Do not have throw rugs and other things on the floor that can make you trip. What can I do in  the bedroom?  Use night lights.  Make sure that you have a light by your bed that is easy to reach.  Do not use any sheets or blankets that are too big for your bed. They should not hang down onto the floor.  Have a firm chair that has side arms. You can use this for support while you get dressed.  Do not have throw rugs and other things on the floor that can make you trip. What can I do in the kitchen?  Clean up any spills right away.  Avoid walking on wet floors.  Keep items that you use a lot in easy-to-reach places.  If you need to reach something above you, use a strong step stool that has a grab bar.  Keep electrical cords out of the way.  Do not use floor polish or wax that makes floors slippery. If you must use wax, use non-skid floor wax.  Do not have throw rugs and other things on the floor that can make you trip. What can I do with my stairs?  Do not leave any items on the stairs.  Make sure that there are  handrails on both sides of the stairs and use them. Fix handrails that are broken or loose. Make sure that handrails are as long as the stairways.  Check any carpeting to make sure that it is firmly attached to the stairs. Fix any carpet that is loose or worn.  Avoid having throw rugs at the top or bottom of the stairs. If you do have throw rugs, attach them to the floor with carpet tape.  Make sure that you have a light switch at the top of the stairs and the bottom of the stairs. If you do not have them, ask someone to add them for you. What else can I do to help prevent falls?  Wear shoes that:  Do not have high heels.  Have rubber bottoms.  Are comfortable and fit you well.  Are closed at the toe. Do not wear sandals.  If you use a stepladder:  Make sure that it is fully opened. Do not climb a closed stepladder.  Make sure that both sides of the stepladder are locked into place.  Ask someone to hold it for you, if possible.  Clearly mark and  make sure that you can see:  Any grab bars or handrails.  First and last steps.  Where the edge of each step is.  Use tools that help you move around (mobility aids) if they are needed. These include:  Canes.  Walkers.  Scooters.  Crutches.  Turn on the lights when you go into a dark area. Replace any light bulbs as soon as they burn out.  Set up your furniture so you have a clear path. Avoid moving your furniture around.  If any of your floors are uneven, fix them.  If there are any pets around you, be aware of where they are.  Review your medicines with your doctor. Some medicines can make you feel dizzy. This can increase your chance of falling. Ask your doctor what other things that you can do to help prevent falls. This information is not intended to replace advice given to you by your health care provider. Make sure you discuss any questions you have with your health care provider. Document Released: 08/01/2009 Document Revised: 03/12/2016 Document Reviewed: 11/09/2014 Elsevier Interactive Patient Education  2017 [employer].

## 2017-06-23 ENCOUNTER — Non-Acute Institutional Stay (SKILLED_NURSING_FACILITY): Payer: Medicare Other | Admitting: Adult Health

## 2017-06-23 ENCOUNTER — Encounter: Payer: Self-pay | Admitting: Adult Health

## 2017-06-23 DIAGNOSIS — F339 Major depressive disorder, recurrent, unspecified: Secondary | ICD-10-CM

## 2017-06-23 DIAGNOSIS — M0579 Rheumatoid arthritis with rheumatoid factor of multiple sites without organ or systems involvement: Secondary | ICD-10-CM

## 2017-06-23 DIAGNOSIS — E538 Deficiency of other specified B group vitamins: Secondary | ICD-10-CM

## 2017-06-23 DIAGNOSIS — G4700 Insomnia, unspecified: Secondary | ICD-10-CM | POA: Diagnosis not present

## 2017-06-23 DIAGNOSIS — I480 Paroxysmal atrial fibrillation: Secondary | ICD-10-CM

## 2017-06-23 DIAGNOSIS — Z7901 Long term (current) use of anticoagulants: Secondary | ICD-10-CM

## 2017-06-23 DIAGNOSIS — M810 Age-related osteoporosis without current pathological fracture: Secondary | ICD-10-CM | POA: Diagnosis not present

## 2017-06-23 DIAGNOSIS — E785 Hyperlipidemia, unspecified: Secondary | ICD-10-CM

## 2017-06-23 DIAGNOSIS — I10 Essential (primary) hypertension: Secondary | ICD-10-CM | POA: Diagnosis not present

## 2017-06-23 NOTE — Progress Notes (Signed)
DATE:  06/23/2017   MRN:  [id number]  BIRTHDAY: 1932/03/09  Facility:  Nursing Home Location:  [facility] [place] [facility] Room Number: 202-A  LEVEL OF CARE:  SNF (31)  Contact Information    Name Relation Home Work Mobile   Harris,Kim Daughter   [phone]   Ivana, Nicastro   [phone]       Code Status History    This patient does not have a recorded code status. Please follow your organizational policy for patients in this situation.       Chief Complaint  Patient presents with  . Medical Management of Chronic Issues    Routine visit    HISTORY OF PRESENT ILLNESS:  This is an 21-YO female seen for a routine visit.  She is a long-term care resident at [facility]. She has a PMH of dementia, essential hypertension, HLD, PAD, atrial fibrillation, and a family history of heart disease.  Latest INR 1.8, currently on Coumadin 2 mg daily for atrial fibrillation. She was seen in her room with husband at bedside.      PAST MEDICAL HISTORY:  Past Medical History:  Diagnosis Date  . Allergy    Remicade caused urticaria  . Anemia    On iron supplement  . Depression    Paxil  . Heart murmur    History of PFO  . Hypertension      CURRENT MEDICATIONS: Reviewed  Patient's Medications  New Prescriptions   No medications on file  Previous Medications   ALENDRONATE (FOSAMAX) 70 MG TABLET    Take 70 mg by mouth once a week. On Mondays   ATORVASTATIN (LIPITOR) 10 MG TABLET    Take 10 mg by mouth daily at 6 PM.    CALCIUM CARBONATE-VITAMIN D3 (CALCIUM 600/VITAMIN D) 600-400 MG-UNIT TABS    Take 1 tablet by mouth 2 (two) times daily.   CELECOXIB (CELEBREX) 200 MG CAPSULE    Take 200 mg by mouth daily.    CHOLECALCIFEROL 1000 UNITS TABLET    Take 1,000 Units by mouth daily.   CICLOPIROX (LOPROX) 0.77 % CREAM    Apply 1 application topically every evening.   CYANOCOBALAMIN (B-12) 1000 MCG/ML KIT    Inject 1,000 mcg as directed every 30  (thirty) days.   DICLOFENAC SODIUM (VOLTAREN) 1 % GEL    Apply 2 g topically every 6 (six) hours as needed (Joint pain).   DOCOSANOL 10 % CREA    Apply 1 application topically every 6 (six) hours as needed (Dry lips).   DOCUSATE SODIUM (COLACE) 100 MG CAPSULE    Take 100 mg by mouth 2 (two) times daily.   DONEPEZIL (ARICEPT) 5 MG TABLET    Take 5 mg by mouth at bedtime.   FOLIC ACID (FOLVITE) 1 MG TABLET    Take 1 mg by mouth daily.    LISINOPRIL (PRINIVIL,ZESTRIL) 20 MG TABLET    Take 20 mg by mouth daily.    MELATONIN 10 MG TABS    Take 10 mg by mouth.   METHOTREXATE (RHEUMATREX) 15 MG TABLET    Take 15 mg by mouth once a week. Caution: Chemotherapy. Protect from light.   METOPROLOL TARTRATE (LOPRESSOR) 25 MG TABLET    Take 25 mg by mouth 2 (two) times daily.   MULTIPLE VITAMINS-MINERALS (MULTIVITAMIN WITH MINERALS) TABLET    Take 1 tablet by mouth daily.   ONDANSETRON (ZOFRAN-ODT) 4 MG DISINTEGRATING TABLET    Take 4 mg by  mouth every 12 (twelve) hours as needed for nausea or vomiting.    PAROXETINE (PAXIL) 40 MG TABLET    Take 40 mg by mouth every morning.   PREDNISONE (DELTASONE) 5 MG TABLET    TAKE ONE TABLET BY MOUTH ONCE DAILY AFTER BREAKFAST   SODIUM CHLORIDE 1 G TABLET    Take 1 g by mouth once a week. On Sundays   WARFARIN SODIUM (COUMADIN PO)    Take 2 mg by mouth daily.   Modified Medications   No medications on file  Discontinued Medications   No medications on file     Allergies  Allergen Reactions  . Remicade [Infliximab]      REVIEW OF SYSTEMS:  GENERAL: no change in appetite, no fatigue, no weight changes, no fever, chills or weakness MOUTH and THROAT: Denies oral discomfort, gingival pain or bleeding, pain from teeth or hoarseness   RESPIRATORY: no cough, SOB, DOE, wheezing, hemoptysis CARDIAC: no chest pain, edema or palpitations GI: no abdominal pain, diarrhea, constipation, heart burn, nausea or vomiting GU: Denies dysuria, frequency, hematuria, incontinence,  or discharge PSYCHIATRIC: Denies feeling of depression or anxiety. No report of hallucinations, insomnia, paranoia, or agitation    PHYSICAL EXAMINATION  GENERAL APPEARANCE: Well nourished. In no acute distress. Normal body habitus SKIN:  Skin is warm and dry.  MOUTH and THROAT: Lips are without lesions. Oral mucosa is moist and without lesions.  RESPIRATORY: breathing is even & unlabored, BS CTAB CARDIAC: RRR, no murmur,no extra heart sounds, no edema GI: abdomen soft, normal BS, no masses, no tenderness, no hepatomegaly, no splenomegaly EXTREMITIES:  Able to move X 4 extremities, bilateral hands has arthritic deformities, limited ROM on right shoulder PSYCHIATRIC: Alert and oriented X 3. Affect and behavior are appropriate    LABS/RADIOLOGY: Labs reviewed: Basic Metabolic Panel:  Recent Labs  04/22/17  NA 132*  K 5.1  BUN 20  CREATININE 1.1   CBC:  Recent Labs  04/22/17  WBC 8.2  NEUTROABS 5  HGB 12.1  HCT 38  PLT 356    ASSESSMENT/PLAN:   1. PAF (paroxysmal atrial fibrillation) (HCC) - rate-controlled, Continue metoprolol tartrate 25 mg 1 tab twice a day and Coumadin   2. Long term current use of anticoagulant - INR 1.8, subtherapeutic,  start Coumadin 2.5 mg by mouth every Wednesdays and Coumadin 2 mg 1 tab every Monday, Tuesday, Thursday, Friday, Saturday and Sunday, check INR on 06/28/17   3. Essential hypertension - well controlled, continue metoprolol tartrate 25 mg 1 tab twice a day, lisinopril 20 mg 1 tab daily   4. Osteoporosis, unspecified osteoporosis type, unspecified pathological fracture presence - continue alendronate sodium 70 mg once a week   5. Recurrent major depressive disorder, remission status unspecified (HCC) - mood is stable; continue paroxetine 40 mg 1 tab daily   6. Rheumatoid arthritis involving multiple sites with positive rheumatoid factor (HCC) - stable,  continue diclofenac sodium 1% gel topically every 6 hours when necessary,  methotrexate 15 mg 1 tab every Mondays, Celebrex 200 mg 1 capsule daily and prednisone 5 mg 1 tab daily   7. Insomnia, unspecified type - verbalized sleeping for 10 hours last night, continue melatonin 10 mg 1 tab daily at bedtime   8. Hyperlipidemia, unspecified hyperlipidemia type - continue atorvastatin 10 mg 1 tab daily at bedtime   9. Vitamin B 12 deficiency - continue cyanocobalamin 1000 g IM monthly  .   Goals of care:  Long-term care  Monina C. [place] - NP   [employer] [phone]

## 2017-06-24 LAB — POCT INR: INR: 1.8 — AB (ref 0.9–1.1)

## 2017-06-24 LAB — PROTIME-INR: Protime: 20.3 — AB (ref 10.0–13.8)

## 2017-06-25 ENCOUNTER — Telehealth: Payer: Self-pay | Admitting: Cardiovascular Disease

## 2017-06-25 NOTE — Telephone Encounter (Signed)
New Message  Cassandra Newton called from [facility] to see if it would be okay to prescribe eliquis for her coumadin. Please call back to discuss

## 2017-06-25 NOTE — Telephone Encounter (Signed)
Spoke to [place] at [place], states the MD at [facility] wants to change patient from Coumadin to Eliquis.  States eliquis is covered under patients insurance plan and states MD wanted to verify that this would be an appropriate change.    Advised I would route to MD and pharm for review.    HX: PAF

## 2017-06-25 NOTE — Telephone Encounter (Signed)
Left message to call back  

## 2017-06-25 NOTE — Telephone Encounter (Signed)
Spoke to [place] and made aware of recommendations.  Verbalized understanding.

## 2017-06-25 NOTE — Telephone Encounter (Signed)
No indication of valvular AF.  Ok to switch to Eliquis 5 mg bid.  Be sure to check INR and start Eliquis when INR at or below 2.0

## 2017-07-19 ENCOUNTER — Encounter: Payer: Self-pay | Admitting: Adult Health

## 2017-07-19 ENCOUNTER — Non-Acute Institutional Stay (SKILLED_NURSING_FACILITY): Payer: Medicare Other | Admitting: Adult Health

## 2017-07-19 DIAGNOSIS — M810 Age-related osteoporosis without current pathological fracture: Secondary | ICD-10-CM

## 2017-07-19 DIAGNOSIS — F339 Major depressive disorder, recurrent, unspecified: Secondary | ICD-10-CM

## 2017-07-19 DIAGNOSIS — G4700 Insomnia, unspecified: Secondary | ICD-10-CM

## 2017-07-19 DIAGNOSIS — K5901 Slow transit constipation: Secondary | ICD-10-CM | POA: Diagnosis not present

## 2017-07-19 DIAGNOSIS — M0579 Rheumatoid arthritis with rheumatoid factor of multiple sites without organ or systems involvement: Secondary | ICD-10-CM | POA: Diagnosis not present

## 2017-07-19 DIAGNOSIS — I10 Essential (primary) hypertension: Secondary | ICD-10-CM | POA: Diagnosis not present

## 2017-07-19 DIAGNOSIS — I480 Paroxysmal atrial fibrillation: Secondary | ICD-10-CM

## 2017-07-19 DIAGNOSIS — E871 Hypo-osmolality and hyponatremia: Secondary | ICD-10-CM

## 2017-07-19 NOTE — Progress Notes (Signed)
DATE:  07/19/2017   MRN:  [id number]  BIRTHDAY: August 02, 1932  Facility:  Nursing Home Location:  [facility] Room Number: 202-A  LEVEL OF CARE:  SNF (31)  Contact Information    Name Relation Home Work Mobile   Harris,Kim Daughter   [phone]   Calley, Drenning   [phone]       Code Status History    This patient does not have a recorded code status. Please follow your organizational policy for patients in this situation.       Chief Complaint  Patient presents with  . Medical Management of Chronic Issues    Routine visit    HISTORY OF PRESENT ILLNESS:  This is an 37-YO female seen for a routine visit.  She is a long-term care resident of [facility].  She has a PMH of dementia, essential HTN, PAD, HLD, A. Fib, and a family history of heart disease. She was seen in the room with husband @ bedside.  Coumadin was recently changed to Eliquis due to INR was unstable. She complained of constipation.    PAST MEDICAL HISTORY:  Past Medical History:  Diagnosis Date  . Allergy    Remicade caused urticaria  . Anemia    On iron supplement  . Depression    Paxil  . Heart murmur    History of PFO  . Hypertension      CURRENT MEDICATIONS: Reviewed  Patient's Medications  New Prescriptions   No medications on file  Previous Medications   ALENDRONATE (FOSAMAX) 70 MG TABLET    Take 70 mg by mouth once a week. On Mondays   APIXABAN (ELIQUIS) 5 MG TABS TABLET    Take 5 mg by mouth 2 (two) times daily.   ATORVASTATIN (LIPITOR) 10 MG TABLET    Take 10 mg by mouth daily at 6 PM.    CALCIUM CARBONATE-VITAMIN D3 (CALCIUM 600/VITAMIN D) 600-400 MG-UNIT TABS    Take 1 tablet by mouth 2 (two) times daily.   CELECOXIB (CELEBREX) 200 MG CAPSULE    Take 200 mg by mouth daily.    CHOLECALCIFEROL 1000 UNITS TABLET    Take 1,000 Units by mouth daily.   CICLOPIROX (LOPROX) 0.77 % CREAM    Apply 1 application topically at bedtime.      CYANOCOBALAMIN (B-12) 1000 MCG/ML KIT    Inject 1,000 mcg as directed every 30 (thirty) days.   DICLOFENAC SODIUM (VOLTAREN) 1 % GEL    Apply 2 g topically every 6 (six) hours as needed (Joint pain).   DOCOSANOL 10 % CREA    Apply 1 application topically every 6 (six) hours as needed (Dry lips).   DOCUSATE SODIUM (COLACE) 100 MG CAPSULE    Take 100 mg by mouth 2 (two) times daily.   DONEPEZIL (ARICEPT) 5 MG TABLET    Take 5 mg by mouth at bedtime.   FOLIC ACID (FOLVITE) 1 MG TABLET    Take 1 mg by mouth daily.    LISINOPRIL (PRINIVIL,ZESTRIL) 20 MG TABLET    Take 20 mg by mouth daily.    MELATONIN 10 MG TABS    Take 10 mg by mouth at bedtime.    METHOTREXATE (RHEUMATREX) 15 MG TABLET    Take 15 mg by mouth once a week. Caution: Chemotherapy. Protect from light.   METOPROLOL TARTRATE (LOPRESSOR) 25 MG TABLET    Take 25 mg by mouth 2 (two) times daily.   MULTIPLE VITAMINS-MINERALS (MULTIVITAMIN  WITH MINERALS) TABLET    Take 1 tablet by mouth daily.   NUTRITIONAL SUPPLEMENT LIQD    Take 120 mLs by mouth daily.   ONDANSETRON (ZOFRAN-ODT) 4 MG DISINTEGRATING TABLET    Take 4 mg by mouth every 12 (twelve) hours as needed for nausea or vomiting.    PAROXETINE (PAXIL) 40 MG TABLET    Take 40 mg by mouth every morning.   PREDNISONE (DELTASONE) 5 MG TABLET    TAKE ONE TABLET BY MOUTH ONCE DAILY AFTER BREAKFAST   SODIUM CHLORIDE 1 G TABLET    Take 1 g by mouth once a week. On Sundays  Modified Medications   No medications on file  Discontinued Medications   WARFARIN SODIUM (COUMADIN PO)    Take 2 mg by mouth daily.      Allergies  Allergen Reactions  . Remicade [Infliximab]      REVIEW OF SYSTEMS:  GENERAL: no change in appetite, no fatigue, no weight changes, no fever, chills or weakness MOUTH and THROAT: Denies oral discomfort  RESPIRATORY: no cough, SOB, DOE, wheezing, hemoptysis CARDIAC: no chest pain, edema or palpitations GI: no abdominal pain, diarrhea, heart burn, nausea or  vomiting, +constipation GU: Denies dysuria, frequency, hematuria, incontinence, or discharge PSYCHIATRIC: Denies feeling of depression or anxiety. No report of hallucinations, insomnia, paranoia, or agitation   PHYSICAL EXAMINATION  GENERAL APPEARANCE: Well nourished. In no acute distress. Normal body habitus SKIN:  Skin is warm and dry.  MOUTH and THROAT: Lips are without lesions.  RESPIRATORY: breathing is even & unlabored, BS CTAB CARDIAC: RRR, +murmur,no extra heart sounds, no edema GI: abdomen soft, normal BS, no masses, no tenderness, no hepatomegaly, no splenomegaly EXTREMITIES: Able to move X 4 extremities, bilateral hands with arthritic deformities, right shoulder with limited ROM PSYCHIATRIC: Alert and oriented X 3. Affect and behavior are appropriate   LABS/RADIOLOGY: Labs reviewed: Basic Metabolic Panel:  Recent Labs  04/22/17  NA 132*  K 5.1  BUN 20  CREATININE 1.1   CBC:  Recent Labs  04/22/17  WBC 8.2  NEUTROABS 5  HGB 12.1  HCT 38  PLT 356    ASSESSMENT/PLAN:  1. Essential hypertension - Well controlled, continue lisinopril 20 mg 1 tab daily and metoprolol tartrate 25 mg 1 tab twice a day   2. Recurrent major depressive disorder, remission status unspecified ([place]) - continue paroxetine 40 mg 1 tab daily   3. PAF (paroxysmal atrial fibrillation) (HCC) - rate controlled, continue metoprolol tartrate 25 mg 1 tablet twice a day and Eliquis 5 mg 1 tablet twice a day   4. Rheumatoid arthritis involving multiple sites with positive rheumatoid factor (HCC) - stable, continue diclofenac gel 1% topically 2 g every 6 hours when necessary, Celebrex 200 mg 1 capsule daily, methotrexate 15 mg 1 tab every Mondays and prednisone 5 mg 1 tab daily   5. Osteoporosis, unspecified osteoporosis type, unspecified pathological fracture presence - no recent fractures, alendronate 70 mg 1 tab weekly and Calcium 60-Vit D3 400 BID   6. Insomnia, unspecified type - continue  melatonin 10 mg 1 tab daily at bedtime   7. Slow transit constipation - start senna S 8.6-50 mg 2 tabs by mouth twice a day   8. Hyponatremia -  Continue NaCl 1 gm Q Sundays     Goals of care:  Long-term care     Liora Myles C. [place] - NP    [employer] [phone]

## 2017-07-22 LAB — CBC AND DIFFERENTIAL
HCT: 37 (ref 36–46)
HEMOGLOBIN: 12.4 (ref 12.0–16.0)
Neutrophils Absolute: 4
PLATELETS: 260 (ref 150–399)
WBC: 6.5

## 2017-07-22 LAB — HEPATIC FUNCTION PANEL
ALK PHOS: 45 (ref 25–125)
ALT: 11 (ref 7–35)
AST: 17 (ref 13–35)
BILIRUBIN, TOTAL: 0.4

## 2017-07-22 LAB — BASIC METABOLIC PANEL
BUN: 19 (ref 4–21)
Creatinine: 0.7 (ref 0.5–1.1)
Glucose: 92
Potassium: 3.8 (ref 3.4–5.3)
SODIUM: 140 (ref 137–147)

## 2017-07-22 LAB — VITAMIN B12: Vitamin B-12: 1057

## 2017-08-04 ENCOUNTER — Non-Acute Institutional Stay (SKILLED_NURSING_FACILITY): Payer: Medicare Other | Admitting: Adult Health

## 2017-08-04 ENCOUNTER — Encounter: Payer: Self-pay | Admitting: Adult Health

## 2017-08-04 DIAGNOSIS — I959 Hypotension, unspecified: Secondary | ICD-10-CM | POA: Diagnosis not present

## 2017-08-04 NOTE — Progress Notes (Signed)
DATE:  08/04/2017   MRN:  [id number]  BIRTHDAY: 1931/12/05  Facility:  Nursing Home Location:  [facility] Room Number: 202-A  LEVEL OF CARE:  SNF (31)  Contact Information    Name Relation Home Work Mobile   Harris,Kim Daughter   [phone]   Lashunta, Frieden   [phone]       Code Status History    This patient does not have a recorded code status. Please follow your organizational policy for patients in this situation.       Chief Complaint  Patient presents with  . Acute Visit    Weakness and dizziness    HISTORY OF PRESENT ILLNESS:  This is an 85-YO female seen for an acute visit secondary to daughter's concerns that she is weaker, paler, and suffers from occasional dizziness.  She is a long-term care resident at [facility].  She has a PMH of dementia, essential HTN, PAD, A. Fib, HLD, and has a family history of heart diease. She was seen today and husband was in the room. She verbalized feeling weak/no energy. Recent labs done did not show any significant abnormalities. BP reviewed and noted to be soft - 113/49, 87/52? And HR 103, 64, 72. No complaints of dizziness.    PAST MEDICAL HISTORY:  Past Medical History:  Diagnosis Date  . Allergy    Remicade caused urticaria  . Anemia    On iron supplement  . Depression    Paxil  . Heart murmur    History of PFO  . Hypertension      CURRENT MEDICATIONS: Reviewed  Patient's Medications  New Prescriptions   No medications on file  Previous Medications   ALENDRONATE (FOSAMAX) 70 MG TABLET    Take 70 mg by mouth once a week. On Mondays   APIXABAN (ELIQUIS) 5 MG TABS TABLET    Take 5 mg by mouth 2 (two) times daily.   ATORVASTATIN (LIPITOR) 10 MG TABLET    Take 10 mg by mouth daily at 6 PM.    CALCIUM CARBONATE-VITAMIN D3 (CALCIUM 600/VITAMIN D) 600-400 MG-UNIT TABS    Take 1 tablet by mouth 2 (two) times daily.   CELECOXIB (CELEBREX) 200 MG CAPSULE     Take 200 mg by mouth daily.    CHOLECALCIFEROL 1000 UNITS TABLET    Take 1,000 Units by mouth daily.   CICLOPIROX (LOPROX) 0.77 % CREAM    Apply 1 application topically at bedtime.    CYANOCOBALAMIN (B-12) 1000 MCG/ML KIT    Inject 1,000 mcg as directed every 30 (thirty) days.   DICLOFENAC SODIUM (VOLTAREN) 1 % GEL    Apply 2 g topically every 6 (six) hours as needed (Joint pain).   DOCOSANOL 10 % CREA    Apply 1 application topically every 6 (six) hours as needed (Dry lips).   DONEPEZIL (ARICEPT) 5 MG TABLET    Take 5 mg by mouth at bedtime.   FOLIC ACID (FOLVITE) 1 MG TABLET    Take 1 mg by mouth daily.    LISINOPRIL (PRINIVIL,ZESTRIL) 20 MG TABLET    Take 20 mg by mouth daily.    MELATONIN 10 MG TABS    Take 10 mg by mouth at bedtime.    METHOTREXATE (RHEUMATREX) 15 MG TABLET    Take 15 mg by mouth once a week. Caution: Chemotherapy. Protect from light.   METOPROLOL TARTRATE (LOPRESSOR) 25 MG TABLET    Take 25 mg by mouth  2 (two) times daily.   MULTIPLE VITAMINS-MINERALS (MULTIVITAMIN WITH MINERALS) TABLET    Take 1 tablet by mouth daily.   NUTRITIONAL SUPPLEMENT LIQD    Take 120 mLs by mouth daily.   ONDANSETRON (ZOFRAN-ODT) 4 MG DISINTEGRATING TABLET    Take 4 mg by mouth every 12 (twelve) hours as needed for nausea or vomiting.    PAROXETINE (PAXIL) 40 MG TABLET    Take 40 mg by mouth every morning.   PREDNISONE (DELTASONE) 5 MG TABLET    TAKE ONE TABLET BY MOUTH ONCE DAILY AFTER BREAKFAST   SENNOSIDES-DOCUSATE SODIUM (SENOKOT-S) 8.6-50 MG TABLET    Take 2 tablets by mouth 2 (two) times daily.   SODIUM CHLORIDE 1 G TABLET    Take 1 g by mouth once a week. On Sundays  Modified Medications   No medications on file  Discontinued Medications   DOCUSATE SODIUM (COLACE) 100 MG CAPSULE    Take 100 mg by mouth 2 (two) times daily.     Allergies  Allergen Reactions  . Remicade [Infliximab]      REVIEW OF SYSTEMS:  GENERAL: +weakness MOUTH and THROAT: Denies oral discomfort     RESPIRATORY: no cough, SOB, DOE, wheezing, hemoptysis CARDIAC: no chest pain, edema or palpitations GI: no abdominal pain, diarrhea, constipation, heart burn, nausea or vomiting GU: Denies dysuria, frequency, hematuria, incontinence, or discharge PSYCHIATRIC: Denies feeling of depression or anxiety. No report of hallucinations, insomnia, paranoia, or agitation     PHYSICAL EXAMINATION  GENERAL APPEARANCE: Well nourished. In no acute distress.  SKIN:  Skin is warm and dry.  MOUTH and THROAT: Lips are without lesions.  RESPIRATORY: breathing is even & unlabored, BS CTAB CARDIAC: RRR, + murmur,no extra heart sounds, no edema GI: abdomen soft, normal BS, no masses, no tenderness, no hepatomegaly, no splenomegaly EXTREMITIES: Able to move X 4 extremities PSYCHIATRIC: Alert and oriented X 3. Affect and behavior are appropriate   LABS/RADIOLOGY: Labs reviewed: Basic Metabolic Panel:  Recent Labs  04/22/17 07/22/17  NA 132* 140  K 5.1 3.8  BUN 20 19  CREATININE 1.1 0.7   Liver Function Tests:  Recent Labs  07/22/17  AST 17  ALT 11  ALKPHOS 45   CBC:  Recent Labs  04/22/17 07/22/17  WBC 8.2 6.5  NEUTROABS 5 4  HGB 12.1 12.4  HCT 38 37  PLT 356 260     ASSESSMENT/PLAN:  1. Hypotension, unspecified hypotension type - Decrease lisinopril from 20 mg to 10 mg 1 tab daily, continue metoprolol tartrate 25 mg 1 tab twice a day, BP/HR twice a day 1 week     Chavy Avera C. [place] - NP    [employer] [phone]

## 2017-08-16 ENCOUNTER — Encounter: Payer: Self-pay | Admitting: Adult Health

## 2017-08-16 ENCOUNTER — Non-Acute Institutional Stay (SKILLED_NURSING_FACILITY): Payer: Medicare Other | Admitting: Adult Health

## 2017-08-16 DIAGNOSIS — B370 Candidal stomatitis: Secondary | ICD-10-CM | POA: Diagnosis not present

## 2017-08-16 NOTE — Progress Notes (Signed)
DATE:  08/16/2017   MRN:  [id number]  BIRTHDAY: 06/04/1932  Facility:  Nursing Home Location:  [facility] Room Number: 202-A  LEVEL OF CARE:  SNF (31)  Contact Information    Name Relation Home Work Mobile   Harris,Kim Daughter   [phone]   Taeja, Debellis   [phone]       Code Status History    This patient does not have a recorded code status. Please follow your organizational policy for patients in this situation.       Chief Complaint  Patient presents with  . Acute Visit    Oral thrush    HISTORY OF PRESENT ILLNESS:  This is an 89-YO female seen for an acute visit secondary to oral thrush.  She is a long-term care resident of [facility].  She has a PMH of dementia, essential HTN, PAD, HLD, A. fibrillation, and a family history of heart disease. She was seen in the room today with daughter and son-in-law at bedside. Noted a whitish thick coating on patient's tongue. She said that she doesn't taste the food. She denies oral pain. She is currently on antibiotic for UTI.     PAST MEDICAL HISTORY:  Past Medical History:  Diagnosis Date  . Allergy    Remicade caused urticaria  . Anemia    On iron supplement  . Depression    Paxil  . Heart murmur    History of PFO  . Hypertension      CURRENT MEDICATIONS: Reviewed  Patient's Medications  New Prescriptions   No medications on file  Previous Medications   ALENDRONATE (FOSAMAX) 70 MG TABLET    Take 70 mg by mouth once a week. On Mondays   APIXABAN (ELIQUIS) 5 MG TABS TABLET    Take 5 mg by mouth 2 (two) times daily.   ATORVASTATIN (LIPITOR) 10 MG TABLET    Take 10 mg by mouth daily at 6 PM.    CALCIUM CARBONATE-VITAMIN D3 (CALCIUM 600/VITAMIN D) 600-400 MG-UNIT TABS    Take 1 tablet by mouth 2 (two) times daily.   CELECOXIB (CELEBREX) 200 MG CAPSULE    Take 200 mg by mouth daily.    CHOLECALCIFEROL 1000 UNITS TABLET    Take 1,000 Units by mouth  daily.   CICLOPIROX (LOPROX) 0.77 % CREAM    Apply 1 application topically at bedtime.    CIPROFLOXACIN (CIPRO) 250 MG TABLET    Take 250 mg by mouth 2 (two) times daily.   CYANOCOBALAMIN (B-12) 1000 MCG/ML KIT    Inject 1,000 mcg as directed every 30 (thirty) days.   DICLOFENAC SODIUM (VOLTAREN) 1 % GEL    Apply 2 g topically every 6 (six) hours as needed (Joint pain).   DOCOSANOL 10 % CREA    Apply 1 application topically every 6 (six) hours as needed (Dry lips).   DONEPEZIL (ARICEPT) 5 MG TABLET    Take 5 mg by mouth at bedtime.   FOLIC ACID (FOLVITE) 1 MG TABLET    Take 1 mg by mouth daily.    LISINOPRIL (PRINIVIL,ZESTRIL) 10 MG TABLET    Take 10 mg by mouth daily.   LOPERAMIDE (IMODIUM A-D) 2 MG TABLET    Take 4 mg by mouth as needed for diarrhea or loose stools. Give after 1st loose stool prn   MELATONIN 5 MG TABS    Take 1 tablet by mouth daily.   METHOTREXATE (RHEUMATREX) 15 MG TABLET  Take 15 mg by mouth once a week. Caution: Chemotherapy. Protect from light.   METOPROLOL TARTRATE (LOPRESSOR) 25 MG TABLET    Take 25 mg by mouth 2 (two) times daily.   MULTIPLE VITAMINS-MINERALS (MULTIVITAMIN WITH MINERALS) TABLET    Take 1 tablet by mouth daily.   NUTRITIONAL SUPPLEMENT LIQD    Take 120 mLs by mouth daily. [employer]   ONDANSETRON (ZOFRAN-ODT) 4 MG DISINTEGRATING TABLET    Take 4 mg by mouth every 12 (twelve) hours as needed for nausea or vomiting.    PAROXETINE (PAXIL) 40 MG TABLET    Take 40 mg by mouth every morning.   PREDNISONE (DELTASONE) 5 MG TABLET    TAKE ONE TABLET BY MOUTH ONCE DAILY AFTER BREAKFAST   SENNOSIDES-DOCUSATE SODIUM (SENOKOT-S) 8.6-50 MG TABLET    Take 2 tablets by mouth 2 (two) times daily.   SODIUM CHLORIDE 1 G TABLET    Take 1 g by mouth once a week. On Sundays  Modified Medications   No medications on file  Discontinued Medications   LISINOPRIL (PRINIVIL,ZESTRIL) 20 MG TABLET    Take 20 mg by mouth daily.    MELATONIN 10 MG TABS    Take 10 mg by mouth at  bedtime.      Allergies  Allergen Reactions  . Remicade [Infliximab]      REVIEW OF SYSTEMS:  GENERAL: no change in appetite, no fatigue, no weight changes, no fever, chills or weakness MOUTH and THROAT: whitish coating on tongue RESPIRATORY: no cough, SOB, DOE, wheezing, hemoptysis CARDIAC: no chest pain, edema or palpitations GI: no abdominal pain, diarrhea, constipation, heart burn, nausea or vomiting GU: Denies dysuria, frequency, hematuria, incontinence, or discharge PSYCHIATRIC: Denies feeling of depression or anxiety. No report of hallucinations, insomnia, paranoia, or agitation    PHYSICAL EXAMINATION  GENERAL APPEARANCE: Well nourished. In no acute distress. Normal body habitus SKIN:  Skin is warm and dry.  MOUTH and THROAT: hast thick whitish coating on tongue RESPIRATORY: breathing is even & unlabored, BS CTAB CARDIAC: RRR, no murmur,no extra heart sounds, no edema GI: abdomen soft, normal BS, no masses, no tenderness, no hepatomegaly, no splenomegaly EXTREMITIES: Able to move X 4 extremities  PSYCHIATRIC: Alert and oriented X 3. Affect and behavior are appropriate   LABS/RADIOLOGY: Labs reviewed: Basic Metabolic Panel:  Recent Labs  04/22/17 07/22/17  NA 132* 140  K 5.1 3.8  BUN 20 19  CREATININE 1.1 0.7   Liver Function Tests:  Recent Labs  07/22/17  AST 17  ALT 11  ALKPHOS 45   CBC:  Recent Labs  04/22/17 07/22/17  WBC 8.2 6.5  NEUTROABS 5 4  HGB 12.1 12.4  HCT 38 37  PLT 356 260    ASSESSMENT/PLAN:  1. Oral candida - will start on Nystatin 100,000 units/ml swab 5 ml on tongue QID X 2 weeks, oral care BID X 2 weeks    Acasia Skilton C. [place] - NP    [employer] [phone]

## 2017-08-17 LAB — HEPATIC FUNCTION PANEL
ALT: 12 (ref 7–35)
AST: 22 (ref 13–35)
Alkaline Phosphatase: 46 (ref 25–125)
BILIRUBIN, TOTAL: 0.2

## 2017-08-17 LAB — LIPID PANEL
Cholesterol: 146 (ref 0–200)
HDL: 66 (ref 35–70)
LDL Cholesterol: 59
LDL/HDL RATIO: 2.2
Triglycerides: 108 (ref 40–160)

## 2017-08-17 LAB — CBC AND DIFFERENTIAL
HCT: 35 — AB (ref 36–46)
Hemoglobin: 11.8 — AB (ref 12.0–16.0)
Neutrophils Absolute: 6
Platelets: 292 (ref 150–399)
WBC: 8.8

## 2017-08-17 LAB — BASIC METABOLIC PANEL
BUN: 15 (ref 4–21)
CREATININE: 0.7 (ref 0.5–1.1)
GLUCOSE: 91
POTASSIUM: 3.9 (ref 3.4–5.3)
SODIUM: 138 (ref 137–147)

## 2017-08-17 LAB — VITAMIN B12: Vitamin B-12: 965

## 2017-08-19 ENCOUNTER — Encounter: Payer: Self-pay | Admitting: Internal Medicine

## 2017-08-19 ENCOUNTER — Non-Acute Institutional Stay (SKILLED_NURSING_FACILITY): Payer: Medicare Other | Admitting: Internal Medicine

## 2017-08-19 DIAGNOSIS — M0579 Rheumatoid arthritis with rheumatoid factor of multiple sites without organ or systems involvement: Secondary | ICD-10-CM

## 2017-08-19 DIAGNOSIS — J310 Chronic rhinitis: Secondary | ICD-10-CM

## 2017-08-19 DIAGNOSIS — F32A Depression, unspecified: Secondary | ICD-10-CM | POA: Insufficient documentation

## 2017-08-19 DIAGNOSIS — N390 Urinary tract infection, site not specified: Secondary | ICD-10-CM

## 2017-08-19 DIAGNOSIS — F329 Major depressive disorder, single episode, unspecified: Secondary | ICD-10-CM

## 2017-08-19 NOTE — Progress Notes (Signed)
    NURSING HOME LOCATION:  [facility] ROOM NUMBER:  202-A  CODE STATUS:  Full Code  PCP:  Pecola Lawless, MD  [address] [place] [address]   This is a nursing facility follow up of chronic medical diagnoses  Interim medical record and care since last [facility] visit was updated with review of diagnostic studies and change in clinical status since last visit were documented.  HPI: The patient is a permanent resident of facility with medical diagnoses of essential hypertension, PAF, rheumatoid arthritis, recurrent urinary tract infections, and dyslipidemia. There is no diagnosis of dementia or memory deficit, but she is on Aricept. Labs are current. On 07/3814 abnormalities included hemoglobin 11.8 and hematocrit 35. On 10/4 these values had been 12.4 and 37 respectively. She is on methotrexate 15 mg weekly.  Review of systems: She describes chronic fatigue, tired to the point that she will fall asleep. The major symptom is urinary frequency. She has appointment 11/13 with Dr. McDiarmid, urologist. He has recently put her on antibiotic for UTI. She had been followed at [facility] for RA but now will be seeing Dr. Vassie Moselle in [place] on 11/7. Her major symptoms are aching pain in the shoulders. She describes nasal rhinitis without other extrinsic symptoms. She remains depressed despite being on  high-dose Paxil at 40 mg daily.  Constitutional: No fever,significant weight change  Eyes: No redness, discharge, pain, vision change ENT/mouth: No nasal congestion,  purulent discharge, earache,change in hearing ,sore throat  Cardiovascular: No chest pain, palpitations,paroxysmal nocturnal dyspnea, claudication, edema  Respiratory: No cough, sputum production,hemoptysis, DOE , significant snoring,apnea  Gastrointestinal: No heartburn,dysphagia,abdominal pain, nausea / vomiting,rectal bleeding, melena,change in bowels Genitourinary: No dysuria,hematuria, pyuria,  incontinence,  nocturia Dermatologic: No rash, pruritus, change in appearance of skin Neurologic: No dizziness,headache,syncope, seizures, numbness , tingling Endocrine: No change in hair/skin/ nails, excessive thirst, excessive hunger Hematologic/lymphatic: No significant bruising, lymphadenopathy,abnormal bleeding Allergy/immunology: No itchy/ watery eyes, significant sneezing, urticaria, angioedema  Physical exam:  Pertinent or positive findings: she has only one upper tooth and a few lower teeth. There is marked ptosis of the right eye. There is some bronchovesicular quality to her breath sounds. Heart rhythm is slow ;heart sounds are distant. There are marked rheumatoid changes in the hands with deviations. Pedal pulses are decreased. She has 1/2+ edema at the sock line   General appearance:Adequately nourished; no acute distress , increased work of breathing is present.   Lymphatic: No lymphadenopathy about the head, neck, axilla . Eyes: No conjunctival inflammation or lid edema is present. There is no scleral icterus. Ears:  External ear exam shows no significant lesions or deformities.   Nose:  External nasal examination shows no deformity or inflammation. Nasal mucosa are pink and moist without lesions ,exudates Oral exam: lips and gums are healthy appearing.There is no oropharyngeal erythema or exudate . Neck:  No thyromegaly, masses, tenderness noted.    Heart:   regular rhythm. S1 and S2 normal without gallop, murmur, click, rub .  Lungs: without wheezes, rhonchi,rales , rubs. Abdomen:Bowel sounds are normal. Abdomen is soft and nontender with no organomegaly, hernias,masses. GU: deferred  Extremities:  No cyanosis, clubbing  Neurologic exam : Balance,Rhomberg,finger to nose testing could not be completed due to clinical state Deep tendon reflexes are equal Skin: Warm & dry w/o tenting. No significant lesions or rash.  See summary under each active problem in the Problem List with associated  updated therapeutic plan

## 2017-08-19 NOTE — Assessment & Plan Note (Signed)
Psychiatric consultation appears appropriate as she is on high-dose Paxil at this time

## 2017-08-19 NOTE — Assessment & Plan Note (Signed)
Appt with Dr. Perley Jain, Urology 11/13

## 2017-08-20 DIAGNOSIS — J310 Chronic rhinitis: Secondary | ICD-10-CM | POA: Insufficient documentation

## 2017-08-20 NOTE — Assessment & Plan Note (Signed)
Flonase trial

## 2017-08-20 NOTE — Assessment & Plan Note (Signed)
Copy of note to Dr Vassie Moselle. I anticipate he'll repeat CBC & dif due to MTX

## 2017-08-20 NOTE — Patient Instructions (Signed)
See assessment and plan under each diagnosis in the problem list and acutely for this visit 

## 2017-09-30 ENCOUNTER — Non-Acute Institutional Stay (SKILLED_NURSING_FACILITY): Payer: Medicare Other | Admitting: Adult Health

## 2017-09-30 ENCOUNTER — Encounter: Payer: Self-pay | Admitting: Adult Health

## 2017-09-30 DIAGNOSIS — M0579 Rheumatoid arthritis with rheumatoid factor of multiple sites without organ or systems involvement: Secondary | ICD-10-CM | POA: Diagnosis not present

## 2017-09-30 DIAGNOSIS — I10 Essential (primary) hypertension: Secondary | ICD-10-CM

## 2017-09-30 DIAGNOSIS — E871 Hypo-osmolality and hyponatremia: Secondary | ICD-10-CM

## 2017-09-30 DIAGNOSIS — N390 Urinary tract infection, site not specified: Secondary | ICD-10-CM | POA: Diagnosis not present

## 2017-09-30 DIAGNOSIS — G4700 Insomnia, unspecified: Secondary | ICD-10-CM

## 2017-09-30 DIAGNOSIS — M810 Age-related osteoporosis without current pathological fracture: Secondary | ICD-10-CM

## 2017-09-30 DIAGNOSIS — I480 Paroxysmal atrial fibrillation: Secondary | ICD-10-CM

## 2017-09-30 DIAGNOSIS — F339 Major depressive disorder, recurrent, unspecified: Secondary | ICD-10-CM | POA: Diagnosis not present

## 2017-09-30 DIAGNOSIS — J310 Chronic rhinitis: Secondary | ICD-10-CM

## 2017-09-30 NOTE — Progress Notes (Signed)
Location:  [facility] Room Number: 202-A Place of Service:  SNF (31) Provider:  Durenda Age, NP  Patient Care Team: Hendricks Limes, MD as PCP - General (Internal Medicine) Medina-Vargas, Senaida Lange, NP as Nurse Practitioner (Internal Medicine)  Extended Emergency Contact Information Primary Emergency Contact: Felecia Shelling [place] of [place] Phone: [phone] Relation: Daughter Secondary Emergency Contact: Dione Booze Address: [place], [place] [address] Johnnette Litter of [place] Mobile Phone: [phone] Relation: Son  Code Status:  Full Code  Goals of care: Advanced Directive information Advanced Directives 06/22/2017  Does Patient Have a Medical Advance Directive? No  Would patient like information on creating a medical advance directive? No - Patient declined     Chief Complaint  Patient presents with  . Medical Management of Chronic Issues    Routine [facility] visit    HPI:  Pt is an 83 y.o. female seen today for medical management of chronic diseases.  She is a long-term care resident of [facility]. She has a PMH of dementia, PAD, HLD, essential hypertension, atrial fibrillation, and a family history of heart disease. She was seen in the room today sitting on a recliner chair beside her husband. She was recently started on Nitrofurantoin 50 mg for prophylaxis.     Past Medical History:  Diagnosis Date  . Allergy    Remicade caused urticaria  . Anemia    On iron supplement  . Depression    Paxil  . Heart murmur    History of PFO  . Hypertension    Past Surgical History:  Procedure Laterality Date  . AMPUTATION TOE Right    Third right toe  . cataract surgery Bilateral   . Partial amputation finger Right    4th finger partially amputated as a child  . TONSILLECTOMY    . TOTAL KNEE ARTHROPLASTY Bilateral     Allergies  Allergen Reactions  . Remicade [Infliximab]      Outpatient Encounter Medications as of 09/30/2017  Medication Sig  . alendronate (FOSAMAX) 70 MG tablet Take 70 mg by mouth once a week. On Mondays  . apixaban (ELIQUIS) 5 MG TABS tablet Take 5 mg by mouth 2 (two) times daily.  Marland Kitchen atorvastatin (LIPITOR) 10 MG tablet Take 10 mg by mouth daily at 6 PM.   . Calcium Carbonate-Vitamin D3 (CALCIUM 600/VITAMIN D) 600-400 MG-UNIT TABS Take 1 tablet by mouth 2 (two) times daily.  . celecoxib (CELEBREX) 200 MG capsule Take 200 mg by mouth daily.   . Cholecalciferol 1000 units tablet Take 1,000 Units by mouth daily.  . ciclopirox (LOPROX) 0.77 % cream Apply 1 application topically at bedtime.   . Cyanocobalamin (B-12) 1000 MCG/ML KIT Inject 1,000 mcg into the muscle every 30 (thirty) days.   . diclofenac sodium (VOLTAREN) 1 % GEL Apply 2 g topically every 6 (six) hours as needed (Joint pain).  . Docosanol 10 % CREA Apply 1 application topically every 6 (six) hours as needed (Dry lips).  . donepezil (ARICEPT) 5 MG tablet Take 5 mg by mouth at bedtime.  . fluticasone (FLONASE) 50 MCG/ACT nasal spray Place 1 spray into both nostrils 2 (two) times daily.  . folic acid (FOLVITE) 1 MG tablet Take 1 mg by mouth daily.   Marland Kitchen lisinopril (PRINIVIL,ZESTRIL) 5 MG tablet Take 5 mg by mouth daily.  Mar[place] Kitchen loperamide (IMODIUM A-D) 2 MG tablet Take 4 mg by mouth as needed  for diarrhea or loose stools. Give after 1st loose stool prn  . Melatonin 5 MG TABS Take 1 tablet by mouth at bedtime as needed.   . methotrexate (RHEUMATREX) 15 MG tablet Take 15 mg by mouth once a week. Caution: Chemotherapy. Protect from light.  . metoprolol tartrate (LOPRESSOR) 25 MG tablet Take 25 mg by mouth 2 (two) times daily.  . Multiple Vitamins-Minerals (MULTIVITAMIN WITH MINERALS) tablet Take 1 tablet by mouth daily.  . nitrofurantoin (MACRODANTIN) 50 MG capsule Take 50 mg by mouth at bedtime.  Marland Kitchen NUTRITIONAL SUPPLEMENT LIQD Take 120 mLs by mouth daily. MedPass  . ondansetron (ZOFRAN-ODT) 4 MG  disintegrating tablet Take 4 mg by mouth every 12 (twelve) hours as needed for nausea or vomiting.   Marland Kitchen PARoxetine (PAXIL) 40 MG tablet Take 40 mg by mouth every morning.  . predniSONE (DELTASONE) 5 MG tablet TAKE ONE TABLET BY MOUTH ONCE DAILY AFTER BREAKFAST  . sennosides-docusate sodium (SENOKOT-S) 8.6-50 MG tablet Take 2 tablets by mouth 2 (two) times daily.  . sodium chloride 1 g tablet Take 1 g by mouth once a week. On Sundays  . [DISCONTINUED] lisinopril (PRINIVIL,ZESTRIL) 10 MG tablet Take 10 mg by mouth daily.   No facility-administered encounter medications on file as of 09/30/2017.     Review of Systems  GENERAL: No change in appetite, no fatigue, no weight changes, no fever, chills or weakness MOUTH and THROAT: Denies oral discomfort, gingival pain or bleeding RESPIRATORY: no cough, SOB, DOE, wheezing, hemoptysis CARDIAC: No chest pain, edema or palpitations GI: No abdominal pain, diarrhea, constipation, heart burn, nausea or vomiting GU: Denies dysuria, frequency, hematuria, incontinence, or discharge PSYCHIATRIC: Denies feelings of depression or anxiety. No report of hallucinations, insomnia, paranoia, or agitation   Immunization History  Administered Date(s) Administered  . Influenza-Unspecified 08/16/2017  . PPD Test 04/20/2017  . Pneumococcal-Unspecified 10/25/1998  . Tdap 06/29/2017   Pertinent  Health Maintenance Due  Topic Date Due  . DEXA SCAN  05/19/2018 (Originally 10/25/1998)  . PNA vac Low Risk Adult (2 of 2 - PCV13) 07/21/2019 (Originally 10/26/1999)  . INFLUENZA VACCINE  Completed   Fall Risk  06/22/2017  Falls in the past year? No   Functional Status Survey:    Vitals:   09/30/17 0947  BP: 138/72  Pulse: 79  Resp: 20  Temp: 97.9 F (36.6 C)  TempSrc: Oral  SpO2: 98%  Weight: 138 lb 14.2 oz (63 kg)  Height: 5' 6" (1.676 m)   Body mass index is 22.42 kg/m.  Physical Exam  GENERAL APPEARANCE: Well nourished. In no acute distress. Normal body  habitus SKIN:  Skin is warm and dry.  MOUTH and THROAT: Lips are without lesions. Oral mucosa is moist and without lesions.  RESPIRATORY: Breathing is even & unlabored, BS CTAB CARDIAC: RRR, no murmur,no extra heart sounds, no edema GI: Abdomen soft, normal BS, no masses, no tenderness EXTREMITIES:  Able to move X 4 extremities PSYCHIATRIC: Alert and oriented X 3. Affect and behavior are appropriate   Labs reviewed: Recent Labs    04/22/17 07/22/17 08/17/17  NA 132* 140 138  K 5.1 3.8 3.9  BUN _0 CREATININE 1.1 0.7 0.7   Recent Labs    07/22/17 08/17/17  AST 17 22  ALT 11 12  ALKPHOS 45 46   Recent Labs    04/22/17 07/22/17 08/17/17  WBC 8.2 6.5 8.8  NEUTROABS _1 HGB 12.1 12.4 11.8*  HCT 38 37 35*  PLT 356 260 292    Lab Results  Component Value Date   CHOL 146 08/17/2017   HDL 66 08/17/2017   LDLCALC 59 08/17/2017   TRIG 108 08/17/2017    Assessment/Plan  1. Hyponatremia - continue  NaCl 1 gm 1 tab Q Sundays   2. Osteoporosis, unspecified osteoporosis type, unspecified pathological fracture presence - continue alendronate 70 mg 1 tab weekly on Mondays   3. Recurrent major depressive disorder, remission status unspecified ([place]) - continue Paroxetine 40 mg I tab daily   4. Rheumatoid arthritis involving multiple sites with positive rheumatoid factor (HCC) - continue Celebrex 200 mg 1 capsule daily, prednisone 5 mg 1 tab daily, and diclofenac 1% gel 2 g topically every 6 hours when necessary  5. Recurrent UTI - continue Nitrofurantoin 50 mg q HS   6. PAF (paroxysmal atrial fibrillation) (HCC) - rate-controlled, continue metoprolol tartrate 25 mg 1 tab twice a day, and Eliquis wheezing  7. Nonallergic rhinitis - stable, will change Flonase to when necessary   8. Essential hypertension - continue lisinopril 5 mg 1 tablet daily and metoprolol tartrate 25 mg twice a day, BP/HR twice a day  9. Insomnia -  Continue melatonin 5 mg 1 tab daily at  bedtime when necessary     Family/ staff Communication: Discussed plan of care with resident, husband and charge nurse.  Labs/tests ordered:  None  Goals of care:   Long-term care    Durenda Age, NP [facility] [phone] (Monday-Friday 8:00 a.m. - 5:00 p.m.) [phone] (after hours)

## 2017-10-25 ENCOUNTER — Encounter: Payer: Self-pay | Admitting: Adult Health

## 2017-10-25 ENCOUNTER — Non-Acute Institutional Stay (SKILLED_NURSING_FACILITY): Payer: Medicare Other | Admitting: Adult Health

## 2017-10-25 DIAGNOSIS — M810 Age-related osteoporosis without current pathological fracture: Secondary | ICD-10-CM | POA: Diagnosis not present

## 2017-10-25 DIAGNOSIS — E538 Deficiency of other specified B group vitamins: Secondary | ICD-10-CM | POA: Diagnosis not present

## 2017-10-25 DIAGNOSIS — F0150 Vascular dementia without behavioral disturbance: Secondary | ICD-10-CM | POA: Diagnosis not present

## 2017-10-25 DIAGNOSIS — I480 Paroxysmal atrial fibrillation: Secondary | ICD-10-CM | POA: Diagnosis not present

## 2017-10-25 DIAGNOSIS — F339 Major depressive disorder, recurrent, unspecified: Secondary | ICD-10-CM

## 2017-10-25 DIAGNOSIS — G4700 Insomnia, unspecified: Secondary | ICD-10-CM | POA: Diagnosis not present

## 2017-10-25 DIAGNOSIS — M0579 Rheumatoid arthritis with rheumatoid factor of multiple sites without organ or systems involvement: Secondary | ICD-10-CM | POA: Diagnosis not present

## 2017-10-25 DIAGNOSIS — E871 Hypo-osmolality and hyponatremia: Secondary | ICD-10-CM

## 2017-10-25 DIAGNOSIS — N390 Urinary tract infection, site not specified: Secondary | ICD-10-CM

## 2017-10-25 DIAGNOSIS — I10 Essential (primary) hypertension: Secondary | ICD-10-CM

## 2017-10-25 NOTE — Progress Notes (Addendum)
Location:  [facility] Room Number: 202-A Place of Service:  SNF (31) Provider:  Durenda Age, NP  Patient Care Team: Hendricks Limes, MD as PCP - General (Internal Medicine) Medina-Vargas, Senaida Lange, NP as Nurse Practitioner (Internal Medicine)  Extended Emergency Contact Information Primary Emergency Contact: Felecia Shelling [place] of [place] Phone: [phone] Relation: Daughter Secondary Emergency Contact: Dione Booze Address: [place], [place] [address] [place] Litter of [place] Mobile Phone: [phone] Relation: Son  Code Status:  Full Code  Goals of care: Advanced Directive information Advanced Directives 06/22/2017  Does Patient Have a Medical Advance Directive? No  Would patient like information on creating a medical advance directive? No - Patient declined     Chief Complaint  Patient presents with  . Medical Management of Chronic Issues    Routine [facility] visit    HPI:  Pt is an 84 y.o. female seen today for medical management of chronic diseases.  She is a long-term care resident of [facility].  She has a PMH of dementia, PAD, HLD, essential HTN, A. Fib, and a family history of heart disease. She was seen in the room today. She announced that it is her 86th birthday today. She was recently started on Nitrofurantoin 50 mg Q HS for UTI prophylaxis.   Past Medical History:  Diagnosis Date  . Allergy    Remicade caused urticaria  . Anemia    On iron supplement  . Depression    Paxil  . Heart murmur    History of PFO  . Hypertension    Past Surgical History:  Procedure Laterality Date  . AMPUTATION TOE Right    Third right toe  . cataract surgery Bilateral   . Partial amputation finger Right    4th finger partially amputated as a child  . TONSILLECTOMY    . TOTAL KNEE ARTHROPLASTY Bilateral     Allergies  Allergen Reactions  . Remicade [Infliximab]      Outpatient Encounter Medications as of 10/25/2017  Medication Sig  . alendronate (FOSAMAX) 70 MG tablet Take 70 mg by mouth once a week. On Mondays  . apixaban (ELIQUIS) 5 MG TABS tablet Take 5 mg by mouth 2 (two) times daily.  Marland Kitchen atorvastatin (LIPITOR) 10 MG tablet Take 10 mg by mouth daily at 6 PM.   . Calcium Carbonate-Vitamin D3 (CALCIUM 600/VITAMIN D) 600-400 MG-UNIT TABS Take 1 tablet by mouth 2 (two) times daily.  . celecoxib (CELEBREX) 200 MG capsule Take 200 mg by mouth daily.   . Cholecalciferol 1000 units tablet Take 1,000 Units by mouth daily.  . ciclopirox (LOPROX) 0.77 % cream Apply 1 application topically at bedtime.   . Cyanocobalamin (B-12) 1000 MCG/ML KIT Inject 1,000 mcg into the muscle every 30 (thirty) days.   . diclofenac sodium (VOLTAREN) 1 % GEL Apply 2 g topically every 6 (six) hours as needed (Joint pain).  . Docosanol 10 % CREA Apply 1 application topically every 6 (six) hours as needed (Dry lips).  . donepezil (ARICEPT) 5 MG tablet Take 5 mg by mouth at bedtime.  . fluticasone (FLONASE) 50 MCG/ACT nasal spray Place 1 spray into both nostrils 2 (two) times daily.  . folic acid (FOLVITE) 1 MG tablet Take 1 mg by mouth daily.   Marland Kitchen lisinopril (PRINIVIL,ZESTRIL) 5 MG tablet Take 5 mg by mouth daily.  Marland Kitchen loperamide (IMODIUM A-D) 2 MG tablet Take 4 mg by  mouth as needed for diarrhea or loose stools. Give after 1st loose stool prn  . Melatonin 5 MG TABS Take 1 tablet by mouth at bedtime as needed.   . methotrexate (RHEUMATREX) 15 MG tablet Take 15 mg by mouth once a week. Caution: Chemotherapy. Protect from light.  . metoprolol tartrate (LOPRESSOR) 25 MG tablet Take 25 mg by mouth 2 (two) times daily.  . Multiple Vitamins-Minerals (MULTIVITAMIN WITH MINERALS) tablet Take 1 tablet by mouth daily.  . nitrofurantoin (MACRODANTIN) 50 MG capsule Take 50 mg by mouth at bedtime.  Marland Kitchen NUTRITIONAL SUPPLEMENT LIQD Take 120 mLs by mouth daily. MedPass  . ondansetron (ZOFRAN-ODT) 4 MG  disintegrating tablet Take 4 mg by mouth every 12 (twelve) hours as needed for nausea or vomiting.   Marland Kitchen PARoxetine (PAXIL) 40 MG tablet Take 40 mg by mouth every morning.  . predniSONE (DELTASONE) 5 MG tablet TAKE ONE TABLET BY MOUTH ONCE DAILY AFTER BREAKFAST  . sennosides-docusate sodium (SENOKOT-S) 8.6-50 MG tablet Take 2 tablets by mouth 2 (two) times daily.  . sodium chloride 1 g tablet Take 1 g by mouth once a week. On Sundays   No facility-administered encounter medications on file as of 10/25/2017.     Review of Systems  GENERAL: No change in appetite, no fatigue, no weight changes, no fever, chills or weakness MOUTH and THROAT: Denies oral discomfort, gingival pain or bleeding RESPIRATORY: no cough, SOB, DOE, wheezing, hemoptysis CARDIAC: No chest pain, edema or palpitations GI: No abdominal pain, diarrhea, constipation, heart burn, nausea or vomiting GU: Denies dysuria, frequency, hematuria, incontinence, or discharge PSYCHIATRIC: Denies feelings of depression or anxiety. No report of hallucinations, insomnia, paranoia, or agitation    Immunization History  Administered Date(s) Administered  . Influenza-Unspecified 08/16/2017  . PPD Test 04/20/2017  . Pneumococcal-Unspecified 10/25/1998  . Tdap 06/29/2017   Pertinent  Health Maintenance Due  Topic Date Due  . DEXA SCAN  05/19/2018 (Originally 10/25/1998)  . PNA vac Low Risk Adult (2 of 2 - PCV13) 07/21/2019 (Originally 10/26/1999)  . INFLUENZA VACCINE  Completed   Fall Risk  06/22/2017  Falls in the past year? No      Vitals:   10/25/17 0918  BP: 117/74  Pulse: 65  Resp: 20  Temp: 97.8 F (36.6 C)  TempSrc: Oral  SpO2: 95%  Weight: 144 lb 3.2 oz (65.4 kg)  Height: 5' 6" (1.676 m)   Body mass index is 23.27 kg/m.  Physical Exam  GENERAL APPEARANCE: Well nourished. In no acute distress. Normal body habitus SKIN:  Skin is warm and dry.  MOUTH and THROAT: Lips are without lesions. Oral mucosa is moist and  without lesions.  RESPIRATORY: Breathing is even & unlabored, BS CTAB CARDIAC: RRR, no murmur,no extra heart sounds, no edema EXTREMITIES:  Able to move X 4 extremities PSYCHIATRIC: Alert and oriented X 3. Affect and behavior are appropriate   Labs reviewed: Recent Labs    04/22/17 07/22/17 08/17/17  NA 132* 140 138  K 5.1 3.8 3.9  BUN _0 CREATININE 1.1 0.7 0.7   Recent Labs    07/22/17 08/17/17  AST 17 22  ALT 11 12  ALKPHOS 45 46   Recent Labs    04/22/17 07/22/17 08/17/17  WBC 8.2 6.5 8.8  NEUTROABS _1 HGB 12.1 12.4 11.8*  HCT 38 37 35*  PLT 356 260 292    Lab Results  Component Value Date   CHOL 146 08/17/2017   HDL  66 08/17/2017   LDLCALC 59 08/17/2017   TRIG 108 08/17/2017     Assessment/Plan  1. Recurrent UTI - recently started on nitrofurantoin 50 mg 1 capsule daily at bedtime, assist with perineal hygiene   2. Recurrent major depressive disorder, remission status unspecified ([place]) - mood this is stable, continue paroxetine 40 mg 1 tab daily   3. Rheumatoid arthritis involving multiple sites with positive rheumatoid factor (HCC) - stable, continue prednisone 5 mg 1 tab daily, diclofenac sodium 1% gel apply 2 g topically every 6 hours when necessary and Celebrex 200 mg 1 capsule daily   4. Vitamin B 12 deficiency  - Continue cyanocobalamin 1000 g/mL inject 1000 g IM monthly   5. PAF (paroxysmal atrial fibrillation) (HCC) - Rate controlled, continue metoprolol titrate 25 mg 1 tablet twice a day and Eliquis 5 mg 1 tab twice a day   6. Insomnia, unspecified type - Continue melatonin 5 mg 1 tab daily at bedtime when necessary   7. Osteoporosis, unspecified osteoporosis type, unspecified pathological fracture presence - Continue Fosamax 70 mg on Mondays and calcium with vitamin D 3 600-400 1 tab twice a day   8. Essential hypertension - Well controlled, continue lisinopril 5 mg 1 tab daily and metoprolol tartrate 25 mg 1 tab twice a  day  9.  Hyponatremia  -  Continue Sodium chloride 1 g every Sundays, check BMP   10. Dementia -  continue donepezil 5 mg 1 tab daily, supportive care and fall precautions     Family/ staff Communication: Discussed plan of care with resident.  Labs/tests ordered:   BMP   Goals of care:  Long-term care    Durenda Age, NP [facility] [phone] (Monday-Friday 8:00 a.m. - 5:00 p.m.) [phone] (after hours)\

## 2017-10-26 LAB — BASIC METABOLIC PANEL
BUN: 19 (ref 4–21)
CREATININE: 0.9 (ref 0.5–1.1)
GLUCOSE: 92
Potassium: 4 (ref 3.4–5.3)
SODIUM: 139 (ref 137–147)

## 2017-11-17 ENCOUNTER — Encounter: Payer: Self-pay | Admitting: Adult Health

## 2017-11-17 NOTE — Progress Notes (Signed)
This encounter was created in error - please disregard.

## 2017-12-07 ENCOUNTER — Encounter: Payer: Self-pay | Admitting: Adult Health

## 2017-12-07 ENCOUNTER — Non-Acute Institutional Stay (SKILLED_NURSING_FACILITY): Payer: Medicare Other | Admitting: Adult Health

## 2017-12-07 DIAGNOSIS — F0150 Vascular dementia without behavioral disturbance: Secondary | ICD-10-CM

## 2017-12-07 DIAGNOSIS — N3281 Overactive bladder: Secondary | ICD-10-CM

## 2017-12-07 DIAGNOSIS — K122 Cellulitis and abscess of mouth: Secondary | ICD-10-CM

## 2017-12-07 DIAGNOSIS — N390 Urinary tract infection, site not specified: Secondary | ICD-10-CM | POA: Diagnosis not present

## 2017-12-07 DIAGNOSIS — M0579 Rheumatoid arthritis with rheumatoid factor of multiple sites without organ or systems involvement: Secondary | ICD-10-CM | POA: Diagnosis not present

## 2017-12-07 DIAGNOSIS — F339 Major depressive disorder, recurrent, unspecified: Secondary | ICD-10-CM

## 2017-12-07 DIAGNOSIS — J310 Chronic rhinitis: Secondary | ICD-10-CM

## 2017-12-07 DIAGNOSIS — G4700 Insomnia, unspecified: Secondary | ICD-10-CM | POA: Diagnosis not present

## 2017-12-07 DIAGNOSIS — I10 Essential (primary) hypertension: Secondary | ICD-10-CM | POA: Diagnosis not present

## 2017-12-07 DIAGNOSIS — I480 Paroxysmal atrial fibrillation: Secondary | ICD-10-CM | POA: Diagnosis not present

## 2017-12-07 NOTE — Progress Notes (Signed)
Location:  [facility] Room Number: 227-A Place of Service:  SNF (31) Provider:  Medina-Vargas, Monina, NP  Patient Care Team: Hopper, William F, MD as PCP - General (Internal Medicine) Medina-Vargas, Monina C, NP as Nurse Practitioner (Internal Medicine)  Extended Emergency Contact Information Primary Emergency Contact: Harris,Kim  [place] Mobile Phone: [phone] Relation: Daughter Secondary Emergency Contact: Kucharski,Jim Address: [address]          [place], [place] [address] [place] Mobile Phone: [phone] Relation: Son  Code Status:  Full Code  Goals of care: Advanced Directive information Advanced Directives 06/22/2017  Does Patient Have a Medical Advance Directive? No  Would patient like information on creating a medical advance directive? No - Patient declined     Chief Complaint  Patient presents with  . Medical Management of Chronic Issues    Routine [facility] visit    HPI:  Pt is an 84 y.o. female seen today for medical management of chronic diseases.  She is a long-term care resident of [facility].  She has a PMH of dementia, PAD, HLD, essential HTN, A. Fib, and a family history of heart disease. She recently had an incision and drainage of an abscess on her gum by a dentist. She is currently on Amoxicillin 500 mg TID, to complete 10 days treatment. She was referred to oral surgeon per daughter's request.   Past Medical History:  Diagnosis Date  . Allergy    Remicade caused urticaria  . Anemia    On iron supplement  . Depression    Paxil  . Heart murmur    History of PFO  . Hypertension    Past Surgical History:  Procedure Laterality Date  . AMPUTATION TOE Right    Third right toe  . cataract surgery Bilateral   . Partial amputation finger Right    4th finger partially amputated as a child  . TONSILLECTOMY    . TOTAL KNEE ARTHROPLASTY Bilateral     Allergies    Allergen Reactions  . Remicade [Infliximab]     Outpatient Encounter Medications as of 12/07/2017  Medication Sig  . amoxicillin (AMOXIL) 500 MG capsule Take 500 mg by mouth 3 (three) times daily. Give for tooth abscess x10 days, ending 12/13/17  . apixaban (ELIQUIS) 5 MG TABS tablet Take 5 mg by mouth 2 (two) times daily.  . atorvastatin (LIPITOR) 10 MG tablet Take 10 mg by mouth at bedtime.  . Calcium Carbonate-Vitamin D3 (CALCIUM 600/VITAMIN D) 600-400 MG-UNIT TABS Take 1 tablet by mouth 2 (two) times daily.  . celecoxib (CELEBREX) 200 MG capsule Take 200 mg by mouth daily.   . Cholecalciferol 1000 units tablet Take 1,000 Units by mouth daily.  . ciclopirox (LOPROX) 0.77 % cream Apply 1 application topically at bedtime.   . Cyanocobalamin (B-12) 1000 MCG/ML KIT Inject 1,000 mcg into the muscle every 30 (thirty) days.   . cyclobenzaprine (FLEXERIL) 5 MG tablet Take 2.5 mg by mouth 3 (three) times daily as needed for muscle spasms. Take one-half tablet to = 2.5 mg  . diclofenac sodium (VOLTAREN) 1 % GEL Apply 2 g topically every 6 (six) hours as needed (Joint pain).  . Docosanol 10 % CREA Apply 1 application topically every 6 (six) hours as needed (Dry lips).  . donepezil (ARICEPT) 5 MG tablet Take 5 mg by mouth at bedtime.  . fluticasone (FLONASE) 50 MCG/ACT nasal spray Place 1 spray into both nostrils 2 (two)   times daily.  . folic acid (FOLVITE) 1 MG tablet Take 1 mg by mouth daily.   Marland Kitchen lisinopril (PRINIVIL,ZESTRIL) 5 MG tablet Take 5 mg by mouth daily.  Marland Kitchen loperamide (IMODIUM A-D) 2 MG tablet Take 4 mg by mouth as needed for diarrhea or loose stools. Give after 1st loose stool prn  . Melatonin 5 MG TABS Take 1 tablet by mouth at bedtime as needed.   . methotrexate (RHEUMATREX) 15 MG tablet Take 15 mg by mouth once a week. Caution: Chemotherapy. Protect from light.  . metoprolol tartrate (LOPRESSOR) 25 MG tablet Take 25 mg by mouth 2 (two) times daily.  . mirabegron ER (MYRBETRIQ) 50 MG  TB24 tablet Take 50 mg by mouth daily.  . Multiple Vitamins-Minerals (MULTIVITAMIN WITH MINERALS) tablet Take 1 tablet by mouth daily.  . nitrofurantoin (MACRODANTIN) 50 MG capsule Take 50 mg by mouth at bedtime.  Marland Kitchen NUTRITIONAL SUPPLEMENT LIQD Take 120 mLs by mouth daily. MedPass  . ondansetron (ZOFRAN-ODT) 4 MG disintegrating tablet Take 4 mg by mouth every 12 (twelve) hours as needed for nausea or vomiting.   Marland Kitchen PARoxetine (PAXIL) 40 MG tablet Take 40 mg by mouth every morning.  . predniSONE (DELTASONE) 5 MG tablet TAKE ONE TABLET BY MOUTH ONCE DAILY AFTER BREAKFAST  . sennosides-docusate sodium (SENOKOT-S) 8.6-50 MG tablet Take 2 tablets by mouth 2 (two) times daily.  . sodium chloride 1 g tablet Take 1 g by mouth once a week. On Sundays  . trimethoprim (TRIMPEX) 100 MG tablet Take 100 mg by mouth daily.  . [DISCONTINUED] alendronate (FOSAMAX) 70 MG tablet Take 70 mg by mouth once a week. On Mondays  . [DISCONTINUED] atorvastatin (LIPITOR) 10 MG tablet Take 10 mg by mouth daily at 6 PM.    No facility-administered encounter medications on file as of 12/07/2017.     Review of Systems  GENERAL: No change in appetite, no fatigue, no weight changes, no fever, chills or weakness MOUTH and THROAT: Denies oral discomfort, gingival pain or bleeding, pain from teeth or hoarseness   RESPIRATORY: no cough, SOB, DOE, wheezing, hemoptysis CARDIAC: No chest pain, edema or palpitations GI: No abdominal pain, diarrhea, constipation, heart burn, nausea or vomiting GU: Denies dysuria, frequency, hematuria, or discharge PSYCHIATRIC: Denies feelings of depression or anxiety. No report of hallucinations, insomnia, paranoia, or agitation    Immunization History  Administered Date(s) Administered  . Influenza-Unspecified 08/16/2017  . PPD Test 04/20/2017  . Pneumococcal-Unspecified 10/25/1998  . Tdap 06/29/2017   Pertinent  Health Maintenance Due  Topic Date Due  . DEXA SCAN  05/19/2018 (Originally  10/25/1998)  . PNA vac Low Risk Adult (2 of 2 - PCV13) 07/21/2019 (Originally 10/26/1999)  . INFLUENZA VACCINE  Completed   Fall Risk  06/22/2017  Falls in the past year? No      Vitals:   12/07/17 1044  BP: 134/75  Pulse: 64  Resp: 20  Temp: 98.6 F (37 C)  TempSrc: Oral  SpO2: 95%  Weight: 144 lb 3.2 oz (65.4 kg)  Height: 5' 6" (1.676 m)   Body mass index is 23.27 kg/m.  Physical Exam  GENERAL APPEARANCE: Well nourished. In no acute distress. Normal body habitus SKIN:  Skin is warm and dry.  MOUTH and THROAT: Lips are without lesions. Oral mucosa is moist and without lesions. Tongue is normal in shape, size, and color and without lesions RESPIRATORY: Breathing is even & unlabored, BS CTAB CARDIAC: RRR, no murmur,no extra heart sounds, no edema GI: Abdomen  soft, normal BS, no masses, no tenderness EXTREMITIES: Able to move X 4 extremities PSYCHIATRIC: Alert and oriented X 3. Affect and behavior are appropriate   Labs reviewed: Recent Labs    07/22/17 08/17/17 10/26/17  NA 140 138 139  K 3.8 3.9 4.0  BUN 19 15 19  CREATININE 0.7 0.7 0.9   Recent Labs    07/22/17 08/17/17  AST 17 22  ALT 11 12  ALKPHOS 45 46   Recent Labs    04/22/17 07/22/17 08/17/17  WBC 8.2 6.5 8.8  NEUTROABS 5 4 6  HGB 12.1 12.4 11.8*  HCT 38 37 35*  PLT 356 260 292    Lab Results  Component Value Date   CHOL 146 08/17/2017   HDL 66 08/17/2017   LDLCALC 59 08/17/2017   TRIG 108 08/17/2017    Assessment/Plan  1. Essential hypertension - well-controlled, continue Lisinopril 5 mg daily and Metoprolol tartrate 25 mg 1 tab BID   2. PAF (paroxysmal atrial fibrillation) (HCC) - rate-controlled, continue Eliquis 5 mg 1 tab BID and Metoprolol tartrate 25 mg 1 tab BID  3. Insomnia, unspecified type - continue Melatonin 5 mg 1 tab Q HS PRN   4. Rheumatoid arthritis involving multiple sites with positive rheumatoid factor (HCC) - stable, continue Diclofenac NA 1% gel apply 2 gm topically  Q 6 hours PRN   5. Recurrent UTI - no complaints of dysuria nor hematuria, continue Trimethoprim 100 mg daily for prophylaxis, will discontinue Nitrofurantoin   6. Nonallergic rhinitis - continue Fluticasone 50 mcg 1 spray into each nostril BID   7. Recurrent major depressive disorder, remission status unspecified (HCC) - mood is stable, continue  Paroxetine 40 mg 1 tab daily   8. Vascular dementia without behavioral disturbance - continue Donepezil 5 mg Q D, continue supportive care  9. Oral Abscess - S/P I/D by dentist, currently on Amoxicillin 500 mg TID for a total of 10 days, referred to oral surgeon for possible sinus involvement per daughter's request  10. OAB - continue Myrbetriq ER 50 mg daily    Family/ staff Communication: Discussed plan of care with resident and husband.  Labs/tests ordered:  None  Goals of care:   Long-term care  Monina Medina-Vargas, NP [facility] [phone] (Monday-Friday 8:00 a.m. - 5:00 p.m.) [phone] (after hours)  

## 2017-12-21 ENCOUNTER — Encounter: Payer: Self-pay | Admitting: Adult Health

## 2017-12-21 ENCOUNTER — Non-Acute Institutional Stay (SKILLED_NURSING_FACILITY): Payer: Medicare Other | Admitting: Adult Health

## 2017-12-21 DIAGNOSIS — K122 Cellulitis and abscess of mouth: Secondary | ICD-10-CM | POA: Diagnosis not present

## 2017-12-21 NOTE — Progress Notes (Signed)
Location:  [facility] Room Number: 786-L Place of Service:  SNF (31) Provider:  Durenda Age, NP  Patient Care Team: Hendricks Limes, MD as PCP - General (Internal Medicine) Medina-Vargas, Senaida Lange, NP as Nurse Practitioner (Internal Medicine)  Extended Emergency Contact Information Primary Emergency Contact: Felecia Shelling [place] Phone: [phone] Relation: Daughter Secondary Emergency Contact: Dione Booze Address: [place], [place] [address] [address] [place] of [place] Mobile Phone: [phone] Relation: Son  Code Status:  Full Code   Goals of care: Advanced Directive information Advanced Directives 06/22/2017  Does Patient Have a Medical Advance Directive? No  Would patient like information on creating a medical advance directive? No - Patient declined     Chief Complaint  Patient presents with  . Acute Visit    The patient has cough and congestion - daughter states not relieved by previous treatment    HPI:  Pt is a 84 y.o. female seen today for an acute visit.  Her daughter feels that her cough and congestion continues despite treatment.  She is a long-term care resident of [facility].  She has a PMH of dementia, PAD, HLD, essential HTN, A. fibrillation, and a family history of heart disease. She was seen in the room today with husband at bedside. She recently had incision and drainage of gum abscess. She has completed antibiotic therapy, as well. She verbalized that she has no pain on her gum. She has an appointment with an oral surgeon on 12/27/17. She has another appointment on 12/23/17, dermatologist, to check on her moles at her back. No noted coughing during my visit. She has clear breath sounds and no SOB.     Past Medical History:  Diagnosis Date  . Allergy    Remicade caused urticaria  . Anemia    On iron supplement  . Depression    Paxil  . Heart murmur    History of  PFO  . Hypertension    Past Surgical History:  Procedure Laterality Date  . AMPUTATION TOE Right    Third right toe  . cataract surgery Bilateral   . Partial amputation finger Right    4th finger partially amputated as a child  . TONSILLECTOMY    . TOTAL KNEE ARTHROPLASTY Bilateral     Allergies  Allergen Reactions  . Remicade [Infliximab]     Outpatient Encounter Medications as of 12/21/2017  Medication Sig  . acetaminophen (TYLENOL) 325 MG tablet Take 650 mg by mouth every 6 (six) hours as needed.  Marland Kitchen apixaban (ELIQUIS) 5 MG TABS tablet Take 5 mg by mouth 2 (two) times daily.  Marland Kitchen atorvastatin (LIPITOR) 10 MG tablet Take 10 mg by mouth at bedtime.  . Calcium Carbonate-Vitamin D3 (CALCIUM 600/VITAMIN D) 600-400 MG-UNIT TABS Take 1 tablet by mouth 2 (two) times daily.  . celecoxib (CELEBREX) 200 MG capsule Take 200 mg by mouth daily.   . Cholecalciferol 1000 units tablet Take 1,000 Units by mouth daily.  . ciclopirox (LOPROX) 0.77 % cream Apply 1 application topically at bedtime.   . Cyanocobalamin (B-12) 1000 MCG/ML KIT Inject 1,000 mcg into the muscle every 30 (thirty) days.   . cyclobenzaprine (FLEXERIL) 5 MG tablet Take 2.5 mg by mouth 3 (three) times daily as needed for muscle spasms. Take one-half tablet to = 2.5 mg  . diclofenac sodium (VOLTAREN) 1 % GEL Apply 2 g topically every 6 (six) hours as  needed (Joint pain).  . Docosanol 10 % CREA Apply 1 application topically every 6 (six) hours as needed (Dry lips).  . donepezil (ARICEPT) 5 MG tablet Take 5 mg by mouth at bedtime.  . fluticasone (FLONASE) 50 MCG/ACT nasal spray Place 1 spray into both nostrils 2 (two) times daily.  . folic acid (FOLVITE) 1 MG tablet Take 1 mg by mouth daily.   Marland Kitchen lisinopril (PRINIVIL,ZESTRIL) 5 MG tablet Take 5 mg by mouth daily.  Marland Kitchen loperamide (IMODIUM A-D) 2 MG tablet Take 4 mg by mouth as needed for diarrhea or loose stools. Give after 1st loose stool prn  . Melatonin 5 MG TABS Take 1 tablet by  mouth at bedtime as needed.   . methotrexate (RHEUMATREX) 15 MG tablet Take 15 mg by mouth once a week. Caution: Chemotherapy. Protect from light.  . metoprolol tartrate (LOPRESSOR) 25 MG tablet Take 25 mg by mouth 2 (two) times daily.  . mirabegron ER (MYRBETRIQ) 50 MG TB24 tablet Take 50 mg by mouth daily.  . Multiple Vitamins-Minerals (MULTIVITAMIN WITH MINERALS) tablet Take 1 tablet by mouth daily.  . ondansetron (ZOFRAN-ODT) 4 MG disintegrating tablet Take 4 mg by mouth every 12 (twelve) hours as needed for nausea or vomiting.   Marland Kitchen PARoxetine (PAXIL) 40 MG tablet Take 40 mg by mouth every morning.  . predniSONE (DELTASONE) 5 MG tablet TAKE ONE TABLET BY MOUTH ONCE DAILY AFTER BREAKFAST  . sennosides-docusate sodium (SENOKOT-S) 8.6-50 MG tablet Take 2 tablets by mouth 2 (two) times daily.  . sodium chloride 1 g tablet Take 1 g by mouth once a week. On Sundays  . trimethoprim (TRIMPEX) 100 MG tablet Take 100 mg by mouth daily.  . [DISCONTINUED] nitrofurantoin (MACRODANTIN) 50 MG capsule Take 50 mg by mouth at bedtime.  . [DISCONTINUED] NUTRITIONAL SUPPLEMENT LIQD Take 120 mLs by mouth daily. MedPass   No facility-administered encounter medications on file as of 12/21/2017.     Review of Systems  GENERAL: No change in appetite, no fatigue, no weight changes, no fever, chills or weakness MOUTH and THROAT: Denies oral discomfort, gingival pain or bleeding, pain from teeth or hoarseness   RESPIRATORY: no cough, SOB, DOE, wheezing, hemoptysis CARDIAC: No chest pain, edema or palpitations GI: No abdominal pain, diarrhea, constipation, heart burn, nausea or vomiting GU: Denies dysuria, frequency, hematuria, or discharge PSYCHIATRIC: Denies feelings of depression or anxiety. No report of hallucinations, insomnia, paranoia, or agitation   Immunization History  Administered Date(s) Administered  . Influenza-Unspecified 08/16/2017  . PPD Test 04/20/2017  . Pneumococcal-Unspecified 10/25/1998  .  Tdap 06/29/2017   Pertinent  Health Maintenance Due  Topic Date Due  . DEXA SCAN  05/19/2018 (Originally 10/25/1998)  . PNA vac Low Risk Adult (2 of 2 - PCV13) 07/21/2019 (Originally 10/26/1999)  . INFLUENZA VACCINE  Completed   Fall Risk  06/22/2017  Falls in the past year? No     Vitals:   12/21/17 1315  BP: 131/78  Pulse: 67  Resp: 18  Temp: 97.8 F (36.6 C)  TempSrc: Oral  SpO2: 95%  Weight: 144 lb 6.4 oz (65.5 kg)  Height: 5' 6"  (1.676 m)   Body mass index is 23.31 kg/m.  Physical Exam  GENERAL APPEARANCE: Well nourished. In no acute distress. Normal body habitus SKIN:  Skin is warm and dry.  MOUTH and THROAT: Lips are without lesions. Oral mucosa is moist and without lesions. Tongue is normal in shape, size, and color and without lesions RESPIRATORY: Breathing is even &  unlabored, BS CTAB CARDIAC: RRR, no murmur,no extra heart sounds, no edema GI: Abdomen soft, normal BS, no masses, no tenderness EXTREMITIES:  Able to move X 4 extremities PSYCHIATRIC: Alert and oriented X 3. Affect and behavior are appropriate   Labs reviewed: Recent Labs    07/22/17 08/17/17 10/26/17  NA 140 138 139  K 3.8 3.9 4.0  BUN 19 15 19   CREATININE 0.7 0.7 0.9   Recent Labs    07/22/17 08/17/17  AST 17 22  ALT 11 12  ALKPHOS 45 46   Recent Labs    04/22/17 07/22/17 08/17/17  WBC 8.2 6.5 8.8  NEUTROABS 5 4 6   HGB 12.1 12.4 11.8*  HCT 38 37 35*  PLT 356 260 292    Lab Results  Component Value Date   CHOL 146 08/17/2017   HDL 66 08/17/2017   LDLCALC 59 08/17/2017   TRIG 108 08/17/2017     Assessment/Plan  1. Oral abscess - S/P incision and drainage and antibiotics, she has been eating without difficulty, continue oral care daily     Durenda Age, NP [facility] [phone] (Monday-Friday 8:00 a.m. - 5:00 p.m.) [phone] (after hours)

## 2017-12-24 LAB — HM DIABETES FOOT EXAM

## 2017-12-27 ENCOUNTER — Encounter: Payer: Self-pay | Admitting: Internal Medicine

## 2017-12-27 DIAGNOSIS — L989 Disorder of the skin and subcutaneous tissue, unspecified: Secondary | ICD-10-CM | POA: Insufficient documentation

## 2017-12-30 ENCOUNTER — Encounter: Payer: Self-pay | Admitting: Internal Medicine

## 2017-12-30 ENCOUNTER — Non-Acute Institutional Stay (SKILLED_NURSING_FACILITY): Payer: Medicare Other | Admitting: Internal Medicine

## 2017-12-30 DIAGNOSIS — Z01818 Encounter for other preprocedural examination: Secondary | ICD-10-CM | POA: Diagnosis not present

## 2017-12-30 DIAGNOSIS — K122 Cellulitis and abscess of mouth: Secondary | ICD-10-CM | POA: Diagnosis not present

## 2017-12-30 DIAGNOSIS — I480 Paroxysmal atrial fibrillation: Secondary | ICD-10-CM | POA: Diagnosis not present

## 2017-12-30 DIAGNOSIS — R05 Cough: Secondary | ICD-10-CM | POA: Diagnosis not present

## 2017-12-30 DIAGNOSIS — M0579 Rheumatoid arthritis with rheumatoid factor of multiple sites without organ or systems involvement: Secondary | ICD-10-CM

## 2017-12-30 DIAGNOSIS — R058 Other specified cough: Secondary | ICD-10-CM

## 2017-12-30 NOTE — Assessment & Plan Note (Addendum)
Cardiology will need to authorize discontinuation of Eliquis for closure of the oral-antral fistula under general anesthesia

## 2017-12-30 NOTE — Assessment & Plan Note (Addendum)
Pre op clearance will require clearance by Dr Allyson Sabal, Cardiologist in reference to Eliquis

## 2017-12-30 NOTE — Progress Notes (Signed)
NURSING HOME LOCATION:  [facility] ROOM NUMBER:  [id number]  CODE STATUS:  Full Code  PCP:  Pecola Lawless, MD  [address] [place] [address]  This is a nursing facility follow up of chronic medical diagnoses and preoperative clearance evaluation as requested by the oral surgeon.    Interim medical record and care since last [facility] visit was updated with review of diagnostic studies and change in clinical status since last visit were documented.  HPI: The patient is a permanent resident of the facility with medical diagnoses of essential hypertension, paroxysmal A. fib, rheumatoid arthritis, recurrent urinary tract infections, and dyslipidemia. The patient is on Aricept, there is no diagnosis of memory deficit or dementia. Significantly she is on methotrexate 15 mg weekly from her Rheumatologist.. She was seen by [facility] NP 12/21/17 for follow-up of cough and congestion for which she received antibiotics. The patient exhibited no cough and pulmonary exam was unremarkable. She recently had I&D of a gum abscess for which she also received antibiotics. Oral surgery follow-up was 12/27/17; she had a dental extraction approximately 2 weeks prior to the follow-up visit. 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 Preop clearance has been requested by Dr. Ocie Doyne The patient was seen by Dr. Doreen Beam, Dermatologist 12/23/17 to assess various skin lesions. He diagnosed benign melanocytic nevi of the trunk and seborrheic keratoses. No specific therapy was recommended. Her Rheumatologist monitors blood counts. The most recent labs in Epic were 10/26/17. Renal function was normal at that time. She has exhibited a mild anemia, hemoglobin was 11.8 on 08/17/17.  Review of systems: Her main complaint is urinary frequency. Intermittently she has some dysuria. She has a follow-up with her Urologist in the near future. She denies any symptoms to suggest  active rhinosinusitis. She states she does have some intermittent yellow sputum production.  Constitutional: No fever, significant weight change, fatigue  Eyes: No redness, discharge, pain, vision change ENT/mouth: No nasal congestion,  purulent discharge, earache, change in hearing, sore throat  Cardiovascular: No chest pain, palpitations, paroxysmal nocturnal dyspnea, claudication, edema  Respiratory: No hemoptysis, DOE , significant snoring, apnea   Gastrointestinal: No heartburn, dysphagia, abdominal pain, nausea /vomiting, rectal bleeding, melena, change in bowels Genitourinary: No dysuria, hematuria, pyuria, incontinence, nocturia Dermatologic: No rash, pruritus, change in appearance of skin Neurologic: No dizziness, headache, syncope, seizures, numbness, tingling Psychiatric: No significant anxiety, depression, insomnia, anorexia Endocrine: No change in hair/skin/ nails, excessive thirst, excessive hunger, excessive urination  Hematologic/lymphatic: No significant bruising, lymphadenopathy, abnormal bleeding despite novel anticoagulant for PAF Allergy/immunology: No itchy/watery eyes, significant sneezing, urticaria, angioedema  Physical exam:  Pertinent or positive findings:She has bilateral ptosis. There is asymmetry of the nasolabial folds with the left being decreased. She has a few lower anterior teeth which have excessive plaque. Heart sounds are distant and slightly irregular. A faint flow murmur is suggested. Chest was surprisingly clear except for minor rales at the bases posteriorly. One half plus edema is present. Posterior tibial pulses are decreased greater than the dorsalis pedis pulses. She has classic rheumatoid arthritis changes in her wrists and hands. Lateral deviation of fingers present as well as interosseous wasting. The right great toe is deviated laterally under the second toe. She has isolated flexion contractures in her toes. The third right toe has been amputated,  there is a residual small stump.  General appearance: Adequately nourished; no acute distress, increased work of breathing is  present.   Lymphatic: No lymphadenopathy about the head, neck, axilla. Eyes: No conjunctival inflammation or lid edema is present. There is no scleral icterus. Ears:  External ear exam shows no significant lesions or deformities.   Nose:  External nasal examination shows no deformity or inflammation. Nasal mucosa are pink and moist without lesions, exudates Oral exam:  Lips and gums are healthy appearing. There is no oropharyngeal erythema or exudate. I can not visualize the fistula definitely. Neck:  No thyromegaly, masses, tenderness noted.    Heart:  No gallop, click, rub .  Lungs: without wheezes, rhonchi, rubs. Abdomen:Bowel sounds are normal. Abdomen is soft and nontender with no organomegaly, hernias,masses. GU: deferred  Extremities:  No cyanosis, clubbing  Skin: Warm & dry w/o tenting. No significant lesions or rash.  See summary under each active problem in the Problem List with associated updated therapeutic plan

## 2017-12-30 NOTE — Assessment & Plan Note (Signed)
Check CBC as part of preoperative evaluation

## 2018-01-01 DIAGNOSIS — R05 Cough: Secondary | ICD-10-CM | POA: Insufficient documentation

## 2018-01-01 DIAGNOSIS — R058 Other specified cough: Secondary | ICD-10-CM | POA: Insufficient documentation

## 2018-01-01 NOTE — Patient Instructions (Signed)
See assessment and plan under each diagnosis in the problem list and acutely for this visit 

## 2018-01-06 ENCOUNTER — Encounter: Payer: Self-pay | Admitting: Cardiovascular Disease

## 2018-01-06 ENCOUNTER — Ambulatory Visit (INDEPENDENT_AMBULATORY_CARE_PROVIDER_SITE_OTHER): Payer: Medicare Other | Admitting: Cardiovascular Disease

## 2018-01-06 DIAGNOSIS — E7800 Pure hypercholesterolemia, unspecified: Secondary | ICD-10-CM | POA: Diagnosis not present

## 2018-01-06 DIAGNOSIS — I480 Paroxysmal atrial fibrillation: Secondary | ICD-10-CM | POA: Diagnosis not present

## 2018-01-06 DIAGNOSIS — I10 Essential (primary) hypertension: Secondary | ICD-10-CM

## 2018-01-06 NOTE — Progress Notes (Signed)
01/06/2018 Cassandra Newton   Apr 10, 1932  [id number]  Primary Physician Pecola Lawless, MD Primary Cardiologist: Runell Gess MD Nicholes Calamity, [place]  HPI:  Cassandra Newton is a 84 y.o.  referred by Dr. Ardelle Anton for peripheral vascular evaluation. She is accompanied by her daughter Selena Batten today.I last saw her in the office 05/21/17 She is an 84 year old thin and frail-appearing married Caucasian female mother of 23, grandmother 7 grandchildren who lives with her husband in a nursing home.. She is minimally ambulatory and does walk with a walker. She has rheumatoid arthritis and is status post bilateral total knee replacements. Other problems include hypertension and hyperlipidemia. She has never smoked. She is not diabetic. Her father and brother both had myocardial infarctions. She denies claudication. There is no evidence of critical limb ischemia. Her feet are somewhat purplish in discoloration. Dr. Ardelle Anton noted that her nails were dystrophic, hypertrophic and brittle. The patient does have hammertoe contractures bilaterally. There is no evidence of skin breakdown. She had lower extremity arterial Doppler studies in our office yesterday that showed primarily tibial vessel disease. Her right ABI was 1.1 with occluded posterior tibial. Her left posterior tibial and dorsalis pedis were occluded as well with a patent peroneal artery. Since I saw her 8 months  ago she has remained clinically stable. She's had no issues with her PAD. She apparently is scheduled for oral surgery by Dr. Ocie Doyne. She is referred back here to discuss management of her Eliquis in the setting of her oral surgery.      No outpatient medications have been marked as taking for the 01/06/18 encounter (Office Visit) with Runell Gess, MD.     Allergies  Allergen Reactions  . Remicade [Infliximab]     Social History   Socioeconomic History  . Marital status: Married    Spouse name: Not on file  .  Number of children: Not on file  . Years of education: Not on file  . Highest education level: Not on file  Occupational History  . Not on file  Social Needs  . Financial resource strain: Not on file  . Food insecurity:    Worry: Not on file    Inability: Not on file  . Transportation needs:    Medical: Not on file    Non-medical: Not on file  Tobacco Use  . Smoking status: Never Smoker  . Smokeless tobacco: Never Used  . Tobacco comment: never smoked regularly; long time ago  Substance and Sexual Activity  . Alcohol use: No  . Drug use: No  . Sexual activity: Not on file  Lifestyle  . Physical activity:    Days per week: Not on file    Minutes per session: Not on file  . Stress: Not on file  Relationships  . Social connections:    Talks on phone: Not on file    Gets together: Not on file    Attends religious service: Not on file    Active member of club or organization: Not on file    Attends meetings of clubs or organizations: Not on file    Relationship status: Not on file  . Intimate partner violence:    Fear of current or ex partner: Not on file    Emotionally abused: Not on file    Physically abused: Not on file    Forced sexual activity: Not on file  Other Topics Concern  . Not on file  Social History Narrative  Both she and her husband are long-term care resident of [facility].  Daughter is very involved in care.     Review of Systems: General: negative for chills, fever, night sweats or weight changes.  Cardiovascular: negative for chest pain, dyspnea on exertion, edema, orthopnea, palpitations, paroxysmal nocturnal dyspnea or shortness of breath Dermatological: negative for rash Respiratory: negative for cough or wheezing Urologic: negative for hematuria Abdominal: negative for nausea, vomiting, diarrhea, bright red blood per rectum, melena, or hematemesis Neurologic: negative for visual changes, syncope, or dizziness All other  systems reviewed and are otherwise negative except as noted above.    Blood pressure 102/70, pulse 71, height 5\' 6"  (1.676 m), weight 146 lb (66.2 kg).  General appearance: alert and no distress Neck: no adenopathy, no carotid bruit, no JVD, supple, symmetrical, trachea midline and thyroid not enlarged, symmetric, no tenderness/mass/nodules Lungs: clear to auscultation bilaterally Heart: regular rate and rhythm, S1, S2 normal, no murmur, click, rub or gallop Extremities: extremities normal, atraumatic, no cyanosis or edema Pulses: 2+ and symmetric Skin: Skin color, texture, turgor normal. No rashes or lesions Neurologic: Alert and oriented X 3, normal strength and tone. Normal symmetric reflexes. Normal coordination and gait  EKG sinus rhythm at 70 with left bundle branch block unchanged from prior EKGs. I personally reviewed this EKG.  ASSESSMENT AND PLAN:   Essential hypertension History of essential hypertension with blood pressure measured at 102/70 today. She is on lisinopril. Continue current meds at current dosing.  Hyperlipidemia History of hyperlipidemia on statin therapy followed by her PCP her left lipid profile performed 08/17/17 revealed total" cholesterol 146, LDL 59 and HDL of 66.  PAF (paroxysmal atrial fibrillation) (HCC) History of paroxysmal atrial fibrillation maintaining sinus rhythm on Eliquis oral anticoagulation. She is scheduled for oral surgery to be performed by Dr. 08/19/17. She can stop her Elavil is 3 days prior to surgery and restarted 2 days after.      Ocie Doyne MD FACP,FACC,FAHA, [facility] 01/06/2018 11:23 AM

## 2018-01-06 NOTE — Assessment & Plan Note (Signed)
History of essential hypertension with blood pressure measured at 102/70 today. She is on lisinopril. Continue current meds at current dosing.

## 2018-01-06 NOTE — Patient Instructions (Signed)
Medication Instructions:   Ok to STOP Eliquis 3 days prior to surgery. Restart Eliquis 2 days after surgery.   Follow-Up: Your physician recommends that you schedule a follow-up appointment as needed with Dr. Allyson Sabal.

## 2018-01-06 NOTE — Assessment & Plan Note (Signed)
History of paroxysmal atrial fibrillation maintaining sinus rhythm on Eliquis oral anticoagulation. She is scheduled for oral surgery to be performed by Dr. Ocie Doyne. She can stop her Elavil is 3 days prior to surgery and restarted 2 days after.

## 2018-01-06 NOTE — Assessment & Plan Note (Signed)
History of hyperlipidemia on statin therapy followed by her PCP her left lipid profile performed 08/17/17 revealed total" cholesterol 146, LDL 59 and HDL of 66.

## 2018-01-18 NOTE — H&P (Signed)
HISTORY AND PHYSICAL  Cassandra Newton is a 84 y.o. female patient with CC: Hole in mouth, food goes into sinus HPI: Patient underwent extraction tooth at general dentist February 2019. Developed drainage from sinus.   No diagnosis found.  Past Medical History:  Diagnosis Date  . Allergy    Remicade caused urticaria  . Anemia    On iron supplement  . Depression    Paxil  . Heart murmur    History of PFO  . Hypertension     No current facility-administered medications for this encounter.    Current Outpatient Medications  Medication Sig Dispense Refill  . acetaminophen (TYLENOL) 325 MG tablet Take 650 mg by mouth every 6 (six) hours as needed (pain). Do not exceed 3062m in 24 hour period    . atorvastatin (LIPITOR) 10 MG tablet Take 10 mg by mouth at bedtime.    . bisacodyl (DULCOLAX) 10 MG suppository Place 10 mg rectally daily as needed for moderate constipation. If not relieved by MOM    . Calcium Carbonate-Vitamin D3 (CALCIUM 600/VITAMIN D) 600-400 MG-UNIT TABS Take 1 tablet by mouth 2 (two) times daily.    . celecoxib (CELEBREX) 200 MG capsule Take 200 mg by mouth daily.     . Cholecalciferol 1000 units tablet Take 1,000 Units by mouth daily.    . ciclopirox (LOPROX) 0.77 % cream Apply 1 application topically at bedtime.     . Cyanocobalamin (B-12) 1000 MCG/ML KIT Inject 1,000 mcg into the muscle every 30 (thirty) days. On the 14th of the month.    . cyclobenzaprine (FLEXERIL) 5 MG tablet Take 2.5 mg by mouth every 8 (eight) hours as needed (cervical muscle spasms).     . diclofenac sodium (VOLTAREN) 1 % GEL Apply 2 g topically every 6 (six) hours as needed (Joint pain).    .Marland Kitchendonepezil (ARICEPT) 5 MG tablet Take 5 mg by mouth at bedtime.    . fluticasone (FLONASE) 50 MCG/ACT nasal spray Place 1 spray into both nostrils 2 (two) times daily as needed for allergies.     . folic acid (FOLVITE) 1 MG tablet Take 1 mg by mouth daily.     .Marland Kitchenlisinopril (PRINIVIL,ZESTRIL) 5 MG  tablet Take 5 mg by mouth daily.     .Marland Kitchenloperamide (IMODIUM A-D) 2 MG tablet Take 4 mg by mouth as needed for diarrhea or loose stools. Give after 1st loose stool prn    . magnesium hydroxide (MILK OF MAGNESIA) 400 MG/5ML suspension Take 30 mLs by mouth daily as needed for mild constipation. If no BM in 3 days.    . Melatonin 5 MG TABS Take 5 mg by mouth at bedtime as needed (insomnia).     . methotrexate (RHEUMATREX) 15 MG tablet Take 15 mg by mouth once a week. 160m(1 tablet) every Monday. Caution: Chemotherapy. Protect from light.    . metoprolol tartrate (LOPRESSOR) 25 MG tablet Take 25 mg by mouth 2 (two) times daily.    . Multiple Vitamins-Minerals (MULTIVITAMIN WITH MINERALS) tablet Take 1 tablet by mouth daily.    . ondansetron (ZOFRAN) 4 MG tablet Take 4 mg by mouth every 12 (twelve) hours as needed for nausea or vomiting.    . Marland KitchenARoxetine (PAXIL) 40 MG tablet Take 40 mg by mouth every morning.    . predniSONE (DELTASONE) 5 MG tablet Take 5 mg by mouth daily with breakfast.    . sennosides-docusate sodium (SENOKOT-S) 8.6-50 MG tablet Take 2 tablets by mouth 2 (two)  times daily as needed for constipation.     . sodium chloride 1 g tablet Take 1 g by mouth once a week. On Sundays    . Sodium Phosphates (RA SALINE ENEMA) 19-7 GM/118ML ENEM Place 1 Dose rectally daily as needed (constipation). If unrelieved by bisacodyl suppository    . tolterodine (DETROL LA) 4 MG 24 hr capsule Take 4 mg by mouth daily.    Marland Kitchen trimethoprim (TRIMPEX) 100 MG tablet Take 100 mg by mouth daily.     Marland Kitchen apixaban (ELIQUIS) 5 MG TABS tablet Take 5 mg by mouth 2 (two) times daily.     Allergies  Allergen Reactions  . Remicade [Infliximab] Other (See Comments)    PLEASE CLARIFY. Reaction not listed on MAR from nursing home   Active Problems:   * No active hospital problems. *  Vitals: There were no vitals taken for this visit. Lab results:No results found for this or any previous visit (from the past 47  hour(s)). Radiology Results: No results found. General appearance: alert and no distress Head: Normocephalic, without obvious abnormality, atraumatic Eyes: negative Nose: Nares normal. Septum midline. Mucosa normal. No drainage or sinus tenderness. Throat: 1cm x 1cm oral antral communication right maxilla. Edentulous upper arch. Pharynx clear. No drainage Neck: no adenopathy, supple, symmetrical, trachea midline and thyroid not enlarged, symmetric, no tenderness/mass/nodules Resp: clear to auscultation bilaterally Cardio: regular rate and rhythm, S1, S2 normal, no murmur, click, rub or gallop  Assessment:Right maxillary oral antral communication. Right maxillary sinusitis.  Plan: Closure oral antral communication. Possible Caldwell-Luc sinusotomy. GA. Nasal. Day surgery.   Diona Browner 01/18/2018

## 2018-01-20 ENCOUNTER — Encounter (HOSPITAL_COMMUNITY): Payer: Self-pay | Admitting: Vascular Surgery

## 2018-01-20 NOTE — Progress Notes (Signed)
Anesthesia Chart Review: SAME DAY WORK-UP.  Patient is an 84 year old female posted for dental restoration/extractions on 01/21/18 by Dr. Ocie Doyne. Consent lists procedure as closure right maxillary oral antral fistula.  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 Some records incdicate a history of afib/PAF and DVT, but she was initially started on anticoagulation due to PFO with recurrent TIA symptoms 06/2010. She is a resident at [facility].   - PCP is Dr. Marga Melnick. Last visit 12/30/17 for preoperative evaluation prior to dental surgery. He recommended that a CBC be a part of patient's pre-operative evaluation and deferred Eliquis instructions to cardiology. - Cardiologist is Dr. Nanetta Batty, last visit 01/06/18. He gave permission to hold Eliquis for 3 days prior to surgery and resume 2 days afterwards. Prior to getting established on 06/17/16, notes indicate that she was seeing a cardiologist is [place], [place].  - Rheumatologist is Dr. Demetrios Loll. Last visit 02/23/17 [facility] Everywhere). - Urologist is Dr. Lorin Picket MacDiarmid.  Med list currently includes Eliquis (to hold 3 days prior to surgery), Lipitor, Flexeril, Aricept, Flonase, folic acid, lisinopril, melatonin, methotrexate, Lopressor, Paxil, Zofran, prednisone 5 mg daily, sodium chloride, Detrol LA, trimethorpim. PAT RN to confirm Eliquis instructions when she calls staff at [facility].   EKG 01/06/18 ([facility]): NSR, left BBB. Left BBB also present on 05/21/17 tracing.  TEE 06/30/10 [facility] Everywhere): Interpretation Summary A transesophageal echocardiogram with Doppler and color flow Doppler was performed. Saline contrast injection was performed. - The left  ventricle is normal in size. There is no thrombus. The left atrium is mildly dilated. Injection of contrast documented an interatrial shunt. A patent foramen ovale is suspected. There is mild mitral regurgitation (1+). Mild aortic sclerosis is present with good valvular opening. The left ventricular ejection fraction is normal (60-65%). There is trace tricuspid regurgitation.  Echo (TTE) 06/27/10 [facility] Everywhere): Interpretation Summary A complete portable two-dimensional transthoracic echocardiogram was performed (2D, M-mode, Doppler and color flow Doppler). Saline contrast injection was performed. - The left ventricular wall motion is normal. There is moderate asymmetric left ventricular hypertrophy. There is moderate ( Grade II/IV ) diastolic dysfunction: pseudonormal mitral inflow.  - Injection of contrast documented an interatrial shunt. A patent foramen ovale is suspected.  - There is moderate tricuspid regurgitation (2+).  - Right ventricular systolic pressure is elevated between 30-83mm Hg, consistent with mild pulmonary hypertension.  - Mild aortic sclerosis is present with good valvular opening.  Carotid U/S 06/27/10 [facility] Everywhere): IMPRESSION: 1. No evidence of carotid stenosis.   She had medical and cardiac preoperative input. She is a same day work-up, so she is for labs and further evaluation on the day of surgery.   Velna [facility] Short Stay Center/Anesthesiology Phone [phone] 01/20/2018 12:38 PM

## 2018-01-20 NOTE — Progress Notes (Signed)
Pt pre-op instructions faxed to Herbert Seta, LPN, of [employer]. Please complete pt assessment on DOS. Nurse denies that pt C/O any acute cardiopulmonary issues. Pt under the care of Dr. Allyson Sabal, Cardiology. Nurse stated that pt last dose of Eliquis was January 17, 2018 as instructed by MD. Nurse verbalized understanding of all pre-op instructions. See anesthesia note.

## 2018-01-20 NOTE — Pre-Procedure Instructions (Signed)
    Cassandra Newton  01/20/2018      [id number] - [place], [place] - [address] [place] [address] Phone: [phone] Fax: [phone]    Your procedure is scheduled on Friday, January 21, 2018  Report to [facility] Admitting at 8:30 A.M.  Call this number if you have problems the morning of surgery:  [phone]   Remember:  Do not eat food or drink liquids after midnight.  Take these medicines the morning of surgery with A SIP OF WATER : metoprolol tartrate (LOPRESSOR),  PARoxetine (PAXIL), predniSONE (DELTASONE), if needed:  acetaminophen (TYLENOL) for pain,  ondansetron (ZOFRAN) for nausea or vomiting,  fluticasone (FLONASE)  nasal spray for allergies Stop taking vitamins, fish oil, Melatonin and herbal medications. Do not take any NSAIDs ie: Ibuprofen, Advil, Naproxen (Aleve), Motrin, Celebrex, BC and Goody Powder; stop now.  Do not wear jewelry, make-up or nail polish.  Do not wear lotions, powders, or perfumes, or deodorant.  Do not shave 48 hours prior to surgery.    Do not bring valuables to the hospital.  [facility] is not responsible for any belongings or valuables.  Contacts, dentures or bridgework may not be worn into surgery.  Leave your suitcase in the car.  After surgery it may be brought to your room. For patients admitted to the hospital, discharge time will be determined by your treatment team. Patients discharged the day of surgery will not be allowed to drive home.  Please read over the following fact sheets that you were given.

## 2018-01-21 ENCOUNTER — Encounter (HOSPITAL_COMMUNITY): Payer: Self-pay | Admitting: *Deleted

## 2018-01-21 ENCOUNTER — Ambulatory Visit (HOSPITAL_COMMUNITY): Payer: Medicare Other | Admitting: Vascular Surgery

## 2018-01-21 ENCOUNTER — Ambulatory Visit (HOSPITAL_COMMUNITY)
Admission: RE | Admit: 2018-01-21 | Discharge: 2018-01-21 | Disposition: A | Discharge status: Skilled Nursing Facility | Payer: Medicare Other | Source: Ambulatory Visit | Attending: Oral Surgery | Admitting: Oral Surgery

## 2018-01-21 ENCOUNTER — Encounter (HOSPITAL_COMMUNITY)
Admission: RE | Disposition: A | Discharge status: Skilled Nursing Facility | Payer: Self-pay | Source: Ambulatory Visit | Attending: Oral Surgery

## 2018-01-21 ENCOUNTER — Other Ambulatory Visit: Payer: Self-pay

## 2018-01-21 DIAGNOSIS — Z7952 Long term (current) use of systemic steroids: Secondary | ICD-10-CM | POA: Insufficient documentation

## 2018-01-21 DIAGNOSIS — F329 Major depressive disorder, single episode, unspecified: Secondary | ICD-10-CM | POA: Insufficient documentation

## 2018-01-21 DIAGNOSIS — Z79899 Other long term (current) drug therapy: Secondary | ICD-10-CM | POA: Insufficient documentation

## 2018-01-21 DIAGNOSIS — Z791 Long term (current) use of non-steroidal anti-inflammatories (NSAID): Secondary | ICD-10-CM | POA: Diagnosis not present

## 2018-01-21 DIAGNOSIS — D649 Anemia, unspecified: Secondary | ICD-10-CM | POA: Diagnosis not present

## 2018-01-21 DIAGNOSIS — Z7901 Long term (current) use of anticoagulants: Secondary | ICD-10-CM | POA: Diagnosis not present

## 2018-01-21 DIAGNOSIS — Z8673 Personal history of transient ischemic attack (TIA), and cerebral infarction without residual deficits: Secondary | ICD-10-CM | POA: Diagnosis not present

## 2018-01-21 DIAGNOSIS — J320 Chronic maxillary sinusitis: Secondary | ICD-10-CM | POA: Diagnosis not present

## 2018-01-21 DIAGNOSIS — Z7951 Long term (current) use of inhaled steroids: Secondary | ICD-10-CM | POA: Diagnosis not present

## 2018-01-21 DIAGNOSIS — I10 Essential (primary) hypertension: Secondary | ICD-10-CM | POA: Insufficient documentation

## 2018-01-21 HISTORY — PX: TOOTH EXTRACTION: SHX859

## 2018-01-21 HISTORY — DX: Transient cerebral ischemic attack, unspecified: G45.9

## 2018-01-21 HISTORY — DX: Peripheral vascular disease, unspecified: I73.9

## 2018-01-21 HISTORY — DX: Rheumatoid arthritis, unspecified: M06.9

## 2018-01-21 LAB — CBC
HCT: 37.3 % (ref 36.0–46.0)
Hemoglobin: 11.8 g/dL — ABNORMAL LOW (ref 12.0–15.0)
MCH: 32.8 pg (ref 26.0–34.0)
MCHC: 31.6 g/dL (ref 30.0–36.0)
MCV: 103.6 fL — ABNORMAL HIGH (ref 78.0–100.0)
PLATELETS: 299 10*3/uL (ref 150–400)
RBC: 3.6 MIL/uL — ABNORMAL LOW (ref 3.87–5.11)
RDW: 15 % (ref 11.5–15.5)
WBC: 6.3 10*3/uL (ref 4.0–10.5)

## 2018-01-21 LAB — BASIC METABOLIC PANEL
ANION GAP: 7 (ref 5–15)
BUN: 17 mg/dL (ref 6–20)
CALCIUM: 8.7 mg/dL — AB (ref 8.9–10.3)
CO2: 26 mmol/L (ref 22–32)
CREATININE: 1.11 mg/dL — AB (ref 0.44–1.00)
Chloride: 103 mmol/L (ref 101–111)
GFR calc Af Amer: 51 mL/min — ABNORMAL LOW (ref >60)
GFR, EST NON AFRICAN AMERICAN: 44 mL/min — AB (ref >60)
Glucose, Bld: 92 mg/dL (ref 65–99)
Potassium: 3.6 mmol/L (ref 3.5–5.1)
Sodium: 136 mmol/L (ref 135–145)

## 2018-01-21 SURGERY — DENTAL RESTORATION/EXTRACTIONS
Anesthesia: General | Site: Mouth

## 2018-01-21 MED ORDER — LORATADINE-PSEUDOEPHEDRINE ER 10-240 MG PO TB24: 1.0000 | ORAL_TABLET | Freq: Every day | ORAL | 0 refills | Status: DC

## 2018-01-21 MED ORDER — CEFAZOLIN SODIUM-DEXTROSE 2-4 GM/100ML-% IV SOLN
2.0000 g | INTRAVENOUS | Status: AC
  Administered 2018-01-21: 2 g via INTRAVENOUS
  Filled 2018-01-21: qty 100

## 2018-01-21 MED ORDER — PROPOFOL 10 MG/ML IV BOLUS
INTRAVENOUS | Status: DC | PRN
  Administered 2018-01-21: 100 mg via INTRAVENOUS
  Administered 2018-01-21: 50 mg via INTRAVENOUS

## 2018-01-21 MED ORDER — METOPROLOL TARTRATE 12.5 MG HALF TABLET
ORAL_TABLET | ORAL | Status: AC
Start: 2018-01-21 — End: 2018-01-21
  Administered 2018-01-21: 25 mg
  Filled 2018-01-21: qty 2

## 2018-01-21 MED ORDER — SUCCINYLCHOLINE CHLORIDE 20 MG/ML IJ SOLN
INTRAMUSCULAR | Status: DC | PRN
  Administered 2018-01-21: 100 mg via INTRAVENOUS

## 2018-01-21 MED ORDER — ONDANSETRON HCL 4 MG/2ML IJ SOLN
INTRAMUSCULAR | Status: AC
  Filled 2018-01-21: qty 2

## 2018-01-21 MED ORDER — PROPOFOL 10 MG/ML IV BOLUS
INTRAVENOUS | Status: AC
  Filled 2018-01-21: qty 20

## 2018-01-21 MED ORDER — DEXAMETHASONE SODIUM PHOSPHATE 10 MG/ML IJ SOLN
INTRAMUSCULAR | Status: AC
  Filled 2018-01-21: qty 1

## 2018-01-21 MED ORDER — FENTANYL CITRATE (PF) 100 MCG/2ML IJ SOLN
INTRAMUSCULAR | Status: DC | PRN
  Administered 2018-01-21: 50 ug via INTRAVENOUS

## 2018-01-21 MED ORDER — LIDOCAINE-EPINEPHRINE 2 %-1:100000 IJ SOLN
INTRAMUSCULAR | Status: DC | PRN
  Administered 2018-01-21: 10 mL

## 2018-01-21 MED ORDER — LIDOCAINE HCL (CARDIAC) 20 MG/ML IV SOLN
INTRAVENOUS | Status: DC | PRN
  Administered 2018-01-21: 40 mg via INTRAVENOUS

## 2018-01-21 MED ORDER — LIDOCAINE-EPINEPHRINE 2 %-1:100000 IJ SOLN
INTRAMUSCULAR | Status: AC
  Filled 2018-01-21: qty 1

## 2018-01-21 MED ORDER — DEXAMETHASONE SODIUM PHOSPHATE 10 MG/ML IJ SOLN
INTRAMUSCULAR | Status: DC | PRN
  Administered 2018-01-21: 10 mg via INTRAVENOUS

## 2018-01-21 MED ORDER — OXYCODONE-ACETAMINOPHEN 5-325 MG PO TABS: 1.0000 | ORAL_TABLET | Freq: Four times a day (QID) | ORAL | 0 refills | Status: DC | PRN

## 2018-01-21 MED ORDER — LACTATED RINGERS IV SOLN
INTRAVENOUS | Status: DC
  Administered 2018-01-21: 10:00:00 via INTRAVENOUS

## 2018-01-21 MED ORDER — FENTANYL CITRATE (PF) 250 MCG/5ML IJ SOLN
INTRAMUSCULAR | Status: AC
  Filled 2018-01-21: qty 5

## 2018-01-21 MED ORDER — LIDOCAINE HCL (CARDIAC) 20 MG/ML IV SOLN
INTRAVENOUS | Status: AC
  Filled 2018-01-21: qty 5

## 2018-01-21 MED ORDER — SUCCINYLCHOLINE CHLORIDE 200 MG/10ML IV SOSY
PREFILLED_SYRINGE | INTRAVENOUS | Status: AC
  Filled 2018-01-21: qty 10

## 2018-01-21 MED ORDER — OXYCODONE-ACETAMINOPHEN 5-325 MG PO TABS: 1.0000 | ORAL_TABLET | ORAL | Status: DC | PRN

## 2018-01-21 MED ORDER — OXYMETAZOLINE HCL 0.05 % NA SOLN
NASAL | Status: AC
  Filled 2018-01-21: qty 15

## 2018-01-21 MED ORDER — ARTIFICIAL TEARS OPHTHALMIC OINT
TOPICAL_OINTMENT | OPHTHALMIC | Status: DC | PRN
  Administered 2018-01-21: 1 via OPHTHALMIC

## 2018-01-21 MED ORDER — OXYMETAZOLINE HCL 0.05 % NA SOLN
NASAL | Status: DC | PRN
  Administered 2018-01-21 (×2): 2 via NASAL

## 2018-01-21 MED ORDER — ONDANSETRON HCL 4 MG/2ML IJ SOLN
INTRAMUSCULAR | Status: DC | PRN
  Administered 2018-01-21: 4 mg via INTRAVENOUS

## 2018-01-21 MED ORDER — PHENYLEPHRINE HCL 10 MG/ML IJ SOLN
INTRAMUSCULAR | Status: DC | PRN
  Administered 2018-01-21 (×2): 120 ug via INTRAVENOUS
  Administered 2018-01-21: 80 ug via INTRAVENOUS

## 2018-01-21 MED ORDER — OXYCODONE-ACETAMINOPHEN 5-325 MG PO TABS
ORAL_TABLET | ORAL | Status: AC
  Administered 2018-01-21: 1
  Filled 2018-01-21: qty 1

## 2018-01-21 MED ORDER — MIDAZOLAM HCL 2 MG/2ML IJ SOLN
INTRAMUSCULAR | Status: AC
  Filled 2018-01-21: qty 2

## 2018-01-21 MED ORDER — SODIUM CHLORIDE 0.9 % IR SOLN
Status: DC | PRN
  Administered 2018-01-21: 1000 mL

## 2018-01-21 MED ORDER — AMOXICILLIN-POT CLAVULANATE 875-125 MG PO TABS: 1.0000 | ORAL_TABLET | Freq: Two times a day (BID) | ORAL | 0 refills | Status: DC

## 2018-01-21 SURGICAL SUPPLY — 31 items
BLADE 15 SAFETY STRL DISP (BLADE) ×1 IMPLANT
BLADE SURG 15 STRL LF DISP TIS (BLADE) IMPLANT
BLADE SURG 15 STRL SS (BLADE)
BUR CROSS CUT FISSURE 1.6 (BURR) ×1 IMPLANT
BUR EGG ELITE 4.0 (BURR) ×1 IMPLANT
CANISTER SUCT 3000ML PPV (MISCELLANEOUS) ×2 IMPLANT
COVER SURGICAL LIGHT HANDLE (MISCELLANEOUS) ×2 IMPLANT
GAUZE PACKING FOLDED 2  STR (GAUZE/BANDAGES/DRESSINGS) ×1
GAUZE PACKING FOLDED 2 STR (GAUZE/BANDAGES/DRESSINGS) ×1 IMPLANT
GLOVE BIO SURGEON STRL SZ 6.5 (GLOVE) IMPLANT
GLOVE BIO SURGEON STRL SZ7 (GLOVE) IMPLANT
GLOVE BIO SURGEON STRL SZ7.5 (GLOVE) ×2 IMPLANT
GLOVE BIOGEL PI IND STRL 6.5 (GLOVE) IMPLANT
GLOVE BIOGEL PI IND STRL 7.0 (GLOVE) IMPLANT
GLOVE BIOGEL PI INDICATOR 6.5 (GLOVE)
GLOVE BIOGEL PI INDICATOR 7.0 (GLOVE)
GOWN STRL REUS W/ TWL LRG LVL3 (GOWN DISPOSABLE) ×1 IMPLANT
GOWN STRL REUS W/ TWL XL LVL3 (GOWN DISPOSABLE) ×1 IMPLANT
GOWN STRL REUS W/TWL LRG LVL3 (GOWN DISPOSABLE) ×1
GOWN STRL REUS W/TWL XL LVL3 (GOWN DISPOSABLE) ×1
KIT BASIN OR (CUSTOM PROCEDURE TRAY) ×2 IMPLANT
KIT TURNOVER KIT B (KITS) ×2 IMPLANT
NEEDLE 22X1 1/2 (OR ONLY) (NEEDLE) ×4 IMPLANT
NS IRRIG 1000ML POUR BTL (IV SOLUTION) ×2 IMPLANT
PAD ARMBOARD 7.5X6 YLW CONV (MISCELLANEOUS) ×2 IMPLANT
SPONGE SURGIFOAM ABS GEL 12-7 (HEMOSTASIS) IMPLANT
SUT CHROMIC 3 0 PS 2 (SUTURE) ×3 IMPLANT
SUT VICRYL 3-0 RB1 18 ABS (SUTURE) ×1 IMPLANT
TRAY ENT MC OR (CUSTOM PROCEDURE TRAY) ×2 IMPLANT
TUBING IRRIGATION (MISCELLANEOUS) ×2 IMPLANT
YANKAUER SUCT BULB TIP NO VENT (SUCTIONS) ×2 IMPLANT

## 2018-01-21 NOTE — Anesthesia Preprocedure Evaluation (Addendum)
Anesthesia Evaluation  Patient identified by MRN, date of birth, ID band Patient awake    Reviewed: Allergy & Precautions, NPO status , Patient's Chart, lab work & pertinent test results, reviewed documented beta blocker date and time   History of Anesthesia Complications Negative for: history of anesthetic complications  Airway Mallampati: II  TM Distance: >3 FB Neck ROM: Full    Dental  (+) Edentulous Upper, Dental Advisory Given   Pulmonary neg pulmonary ROS,    breath sounds clear to auscultation       Cardiovascular hypertension, Pt. on medications and Pt. on home beta blockers (-) angina Rhythm:Regular Rate:Normal     Neuro/Psych Depression TIA   GI/Hepatic negative GI ROS, Neg liver ROS,   Endo/Other  negative endocrine ROS  Renal/GU negative Renal ROS     Musculoskeletal  (+) Arthritis , Rheumatoid disorders,    Abdominal   Peds  Hematology negative hematology ROS (+) Eliquis: last dose 01/17/18   Anesthesia Other Findings   Reproductive/Obstetrics                            Anesthesia Physical Anesthesia Plan  ASA: III  Anesthesia Plan: General   Post-op Pain Management:    Induction: Intravenous  PONV Risk Score and Plan: 3 and Ondansetron, Dexamethasone and Treatment may vary due to age or medical condition  Airway Management Planned: Nasal ETT  Additional Equipment:   Intra-op Plan:   Post-operative Plan: Extubation in OR  Informed Consent: I have reviewed the patients History and Physical, chart, labs and discussed the procedure including the risks, benefits and alternatives for the proposed anesthesia with the patient or authorized representative who has indicated his/her understanding and acceptance.   Dental advisory given  Plan Discussed with: Surgeon and CRNA  Anesthesia Plan Comments: (Plan routine monitors, GETA with NT intubation)         Anesthesia Quick Evaluation

## 2018-01-21 NOTE — Anesthesia Postprocedure Evaluation (Signed)
Anesthesia Post Note  Patient: Blessyn [place]  Procedure(s) Performed: Closure Right Maxillary oral antral fistula (N/A Mouth)     Patient location during evaluation: PACU Anesthesia Type: General Level of consciousness: awake and alert, oriented and patient cooperative Pain management: pain level controlled Vital Signs Assessment: post-procedure vital signs reviewed and stable Respiratory status: spontaneous breathing, nonlabored ventilation and respiratory function stable Cardiovascular status: blood pressure returned to baseline and stable Postop Assessment: no apparent nausea or vomiting Anesthetic complications: no    Last Vitals:  Vitals:   01/21/18 1245 01/21/18 1300  BP: (!) 135/93 (!) 141/69  Pulse: (!) 59 (!) 57  Resp: (!) 21 (!) 24  Temp:    SpO2: (!) 88% (!) 89%    Last Pain:  Vitals:   01/21/18 1245  TempSrc:   PainSc: 0-No pain                 Danniel Grenz,E. Jyden Kromer

## 2018-01-21 NOTE — Op Note (Signed)
01/21/2018  11:03 AM  PATIENT:  Cassandra Newton  84 y.o. female  PRE-OPERATIVE DIAGNOSIS: Right maxillary oral antral fistula  POST-OPERATIVE DIAGNOSIS:  SAME  PROCEDURE:  Procedure(s): Closure Right Maxillary oral antral fistula  SURGEON:  Surgeon(s): Ocie Doyne, DDS  ANESTHESIA:   local and general  EBL:  minimal  DRAINS: none   SPECIMEN:  No Specimen  COUNTS:  YES  PLAN OF CARE: Discharge to home after PACU  PATIENT DISPOSITION:  PACU - hemodynamically stable.   PROCEDURE DETAILS: Dictation # [id number]  Georgia Lopes, DMD 01/21/2018 11:03 AM

## 2018-01-21 NOTE — Transfer of Care (Signed)
Immediate Anesthesia Transfer of Care Note  Patient: Cassandra Newton  Procedure(s) Performed: Closure Right Maxillary oral antral fistula (N/A Mouth)  Patient Location: PACU  Anesthesia Type:General  Level of Consciousness: awake, alert , oriented and patient cooperative  Airway & Oxygen Therapy: Patient Spontanous Breathing and Patient connected to face mask oxygen  Post-op Assessment: Report given to RN and Post -op Vital signs reviewed and stable  Post vital signs: Reviewed and stable  Last Vitals:  Vitals Value Taken Time  BP    Temp    Pulse 78 01/21/2018 11:20 AM  Resp 17 01/21/2018 11:20 AM  SpO2 99 % 01/21/2018 11:20 AM  Vitals shown include unvalidated device data.  Last Pain:  Vitals:   01/21/18 0926  TempSrc:   PainSc: 0-No pain      Patients Stated Pain Goal: 2 (01/21/18 0926)  Complications: No apparent anesthesia complications

## 2018-01-21 NOTE — H&P (Signed)
H&P documentation  -History and Physical Reviewed  -Patient has been re-examined  -No change in the plan of care  Corlette Ciano  

## 2018-01-22 ENCOUNTER — Encounter (HOSPITAL_COMMUNITY): Payer: Self-pay | Admitting: Oral Surgery

## 2018-01-24 NOTE — Op Note (Signed)
NAME:  JEZREEL, SISK NO.:  MEDICAL RECORD NO.:  [id number]  LOCATION:                                 FACILITY:  PHYSICIAN:  Georgia Lopes, M.D.       DATE OF BIRTH:  DATE OF PROCEDURE:  01/21/2018 DATE OF DISCHARGE:                              OPERATIVE REPORT   PREOPERATIVE DIAGNOSIS:  Right maxillary oral antral fistula.  POSTOPERATIVE DIAGNOSIS:  Right maxillary oral antral fistula.  PROCEDURE:  Closure of right maxillary oral antral fistula.  SURGEON:  Georgia Lopes, M.D.  ANESTHESIA:  General, nasal intubation.  Dr. Jairo Ben, attending.  INDICATIONS:  The patient underwent removal of maxillary molar by her general dentist approximately 1 month ago.  She developed an opening in the upper mouth on the right side into the sinus, which did not close after antibiotics and decongestants.  It was recommended to the patient undergo the surgical procedure to close the fistula.  DESCRIPTION OF PROCEDURE:  The patient was taken to the operating room, placed on the table in supine position.  General anesthesia was administered, and nasal endotracheal tube was placed and secured.  The eyes were protected, and the patient was draped for surgery.  Time-out was performed.  The posterior pharynx was suctioned.  A throat pack was placed.  Then, 2% lidocaine with 1:100,000 epinephrine was infiltrated in the palatal and buccal infiltration around tooth #3 area.  There was approximately 1 x 1 cm opening into the sinus.  The sinus was lavaged with saline, and curette was used to remove inflamed tissue around the border of the fistula on the maxillary walls of the sinus then the 15 blade was used to make an incision to bone outlining the fistula, and inflamed mucosal tissue was removed then reflection was carried along the buccal aspect of the anterior maxillary wall until the buccal fat pad was encountered.  Then using hemostats and Cushings, the  buccal fat pad was gently teased out of the surrounding tissue.  When suitable length had been harvested, it was left for use later in the case.  Then, a 15 blade was used to make a pedicled incision on the palatal area of the right mouth beginning posteriorly and coming anteriorly to the canine region, which bordered the fistula.  Then, this was reflected in a partial thickness fashion with a 15 blade and then tissue was trimmed surrounding the flap so that the flap could be turned laterally and passively rest over the bony margins of the fistula.  Then, the buccal fat pad was sutured to the palatal tissue using 3-0 chromic and the palatal pedicle graft was sutured to the tissue surrounding the bony margins of the fistula with 3-0 Vicryl.  Then, the patient's maxillary denture was adjusted to fit passively over the graft area for use as a postoperative bandage.  Then, the oral cavity was irrigated, suctioned. Throat pack was removed.  The patient was left in care of Anesthesia for transportation to recovery with plans for discharge home.  ESTIMATED BLOOD LOSS:  Minimal.  COMPLICATIONS:  None.  SPECIMENS:  None.     Georgia Lopes, M.D.  SMJ/MEDQ  D:  01/21/2018  T:  01/21/2018  Job:  [id number]

## 2018-01-25 ENCOUNTER — Non-Acute Institutional Stay (SKILLED_NURSING_FACILITY): Payer: Medicare Other | Admitting: Adult Health

## 2018-01-25 ENCOUNTER — Encounter: Payer: Self-pay | Admitting: Adult Health

## 2018-01-25 DIAGNOSIS — E871 Hypo-osmolality and hyponatremia: Secondary | ICD-10-CM

## 2018-01-25 DIAGNOSIS — M0579 Rheumatoid arthritis with rheumatoid factor of multiple sites without organ or systems involvement: Secondary | ICD-10-CM | POA: Diagnosis not present

## 2018-01-25 DIAGNOSIS — N3281 Overactive bladder: Secondary | ICD-10-CM

## 2018-01-25 DIAGNOSIS — K122 Cellulitis and abscess of mouth: Secondary | ICD-10-CM | POA: Diagnosis not present

## 2018-01-25 DIAGNOSIS — I10 Essential (primary) hypertension: Secondary | ICD-10-CM

## 2018-01-25 DIAGNOSIS — N390 Urinary tract infection, site not specified: Secondary | ICD-10-CM

## 2018-01-25 NOTE — Progress Notes (Signed)
Location:  [place] Room Number: 450-T Place of Service:  SNF (31) Provider:  Durenda Age, NP  Patient Care Team: Hendricks Limes, MD as PCP - General (Internal Medicine) Medina-Vargas, Senaida Lange, NP as Nurse Practitioner (Internal Medicine)  Extended Emergency Contact Information Primary Emergency Contact: Felecia Shelling [place] of [place] Phone: [phone] Relation: Daughter Secondary Emergency Contact: Dione Booze Address: [place], [place] [address] [address] [place] of [place] Mobile Phone: [phone] Relation: Son  Code Status:  Full Code  Goals of care: Advanced Directive information Advanced Directives 01/21/2018  Does Patient Have a Medical Advance Directive? Yes  Type of Advance Directive [place]  Does patient want to make changes to medical advance directive? No - Patient declined  Copy of [place] in Chart? No - copy requested  Would patient like information on creating a medical advance directive? -     Chief Complaint  Patient presents with  . Medical Management of Chronic Issues    Routine [facility] visit    HPI:  Cassandra Newton is an 84 y.o. female seen today for medical management of chronic diseases.  She is a long-term care resident of [facility].  She has a PMH of dementia, PAD, HLD, essential hypertension, atrial fibrillation, and a family history of heart disease. A month ago, she had a removal of maxillary molar then she developed an opening in the upper mouth on the right side onto the sinus which did not close. 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 She was seen in the room today. No noted edema of face nor erythema in her mouth.     Past Medical History:  Diagnosis Date  . Allergy    Remicade caused urticaria  . Anemia    On iron supplement  . Depression    Paxil  . Heart murmur    History of PFO  .  Hypertension   . Peripheral vascular disease ([place])   . Rheumatoid arthritis ([place])   . TIA (transient ischemic attack)    06/2010   Past Surgical History:  Procedure Laterality Date  . AMPUTATION TOE Right    Third right toe  . cataract surgery Bilateral   . Partial amputation finger Right    4th finger partially amputated as a child  . TONSILLECTOMY    . TOOTH EXTRACTION N/A 01/21/2018   Procedure: Closure Right Maxillary oral antral fistula;  Surgeon: Diona Browner, DDS;  Location: [place];  Service: Oral Surgery;  Laterality: N/A;  . TOTAL KNEE ARTHROPLASTY Bilateral     Allergies  Allergen Reactions  . Remicade [Infliximab] Other (See Comments)    PLEASE CLARIFY. Reaction not listed on MAR from nursing home    Outpatient Encounter Medications as of 01/25/2018  Medication Sig  . acetaminophen (TYLENOL) 325 MG tablet Take 650 mg by mouth every 6 (six) hours as needed (pain). Do not exceed 3041m in 24 hour period  . amoxicillin-clavulanate (AUGMENTIN) 875-125 MG tablet Take 1 tablet by mouth 2 (two) times daily.  .Marland Kitchenapixaban (ELIQUIS) 5 MG TABS tablet Take 5 mg by mouth 2 (two) times daily.  .Marland Kitchenatorvastatin (LIPITOR) 10 MG tablet Take 10 mg by mouth at bedtime.  . bisacodyl (DULCOLAX) 10 MG suppository Place 10 mg rectally daily as needed for moderate constipation. If not relieved by MOM  . Calcium Carbonate-Vitamin D3 (CALCIUM 600/VITAMIN D) 600-400  MG-UNIT TABS Take 1 tablet by mouth 2 (two) times daily.  . celecoxib (CELEBREX) 200 MG capsule Take 200 mg by mouth daily.   . Cholecalciferol 1000 units tablet Take 1,000 Units by mouth daily.  . ciclopirox (LOPROX) 0.77 % cream Apply 1 application topically at bedtime.   . Cyanocobalamin (B-12) 1000 MCG/ML KIT Inject 1,000 mcg into the muscle every 30 (thirty) days. On the 14th of the month.  . cyclobenzaprine (FLEXERIL) 5 MG tablet Take 2.5 mg by mouth every 8 (eight) hours as needed (cervical muscle spasms).   . diclofenac sodium  (VOLTAREN) 1 % GEL Apply 2 g topically every 6 (six) hours as needed (Joint pain).  Marland Kitchen donepezil (ARICEPT) 5 MG tablet Take 5 mg by mouth at bedtime.  . fluticasone (FLONASE) 50 MCG/ACT nasal spray Place 1 spray into both nostrils 2 (two) times daily as needed for allergies.   . folic acid (FOLVITE) 1 MG tablet Take 1 mg by mouth daily.   Marland Kitchen lisinopril (PRINIVIL,ZESTRIL) 5 MG tablet Take 5 mg by mouth daily.   Marland Kitchen loperamide (IMODIUM A-D) 2 MG tablet Take 4 mg by mouth as needed for diarrhea or loose stools. Give after 1st loose stool prn  . loratadine-pseudoephedrine (CLARITIN-D 24 HOUR) 10-240 MG 24 hr tablet Take 1 tablet by mouth daily.  . magnesium hydroxide (MILK OF MAGNESIA) 400 MG/5ML suspension Take 30 mLs by mouth daily as needed for mild constipation. If no BM in 3 days.  . Melatonin 5 MG TABS Take 5 mg by mouth at bedtime as needed (insomnia).   . methotrexate (RHEUMATREX) 15 MG tablet Take 15 mg by mouth once a week. 9m (1 tablet) every Monday. Caution: Chemotherapy. Protect from light.  . metoprolol tartrate (LOPRESSOR) 25 MG tablet Take 25 mg by mouth 2 (two) times daily.  . Multiple Vitamins-Minerals (MULTIVITAMIN WITH MINERALS) tablet Take 1 tablet by mouth daily.  . ondansetron (ZOFRAN) 4 MG tablet Take 4 mg by mouth every 12 (twelve) hours as needed for nausea or vomiting.  .Marland KitchenoxyCODONE-acetaminophen (PERCOCET) 5-325 MG tablet Take 1 tablet by mouth every 6 (six) hours as needed.  .Marland KitchenPARoxetine (PAXIL) 40 MG tablet Take 40 mg by mouth every morning.  . predniSONE (DELTASONE) 5 MG tablet Take 5 mg by mouth daily with breakfast.   . sennosides-docusate sodium (SENOKOT-S) 8.6-50 MG tablet Take 2 tablets by mouth 2 (two) times daily as needed for constipation.   . sodium chloride 1 g tablet Take 1 g by mouth once a week. On Sundays  . Sodium Phosphates (RA SALINE ENEMA) 19-7 GM/118ML ENEM Place 1 Dose rectally daily as needed (constipation). If unrelieved by bisacodyl suppository  .  tolterodine (DETROL LA) 4 MG 24 hr capsule Take 4 mg by mouth daily.  .Marland Kitchentrimethoprim (TRIMPEX) 100 MG tablet Take 100 mg by mouth daily.    No facility-administered encounter medications on file as of 01/25/2018.     Review of Systems  GENERAL: No change in appetite, no fatigue, no weight changes, no fever, chills or weakness MOUTH and THROAT: Denies oral discomfort, gingival pain or bleeding, pain from teeth or hoarseness   RESPIRATORY: no cough, SOB, DOE, wheezing, hemoptysis CARDIAC: No chest pain, edema or palpitations GI: No abdominal pain, diarrhea, constipation, heart burn, nausea or vomiting GU: Denies dysuria, frequency, hematuria, incontinence, or discharge PSYCHIATRIC: Denies feelings of depression or anxiety. No report of hallucinations, insomnia, paranoia, or agitation   Immunization History  Administered Date(s) Administered  . Influenza-Unspecified 08/16/2017  .  PPD Test 04/20/2017  . Pneumococcal-Unspecified 10/25/1998  . Tdap 06/29/2017   Pertinent  Health Maintenance Due  Topic Date Due  . DEXA SCAN  05/19/2018 (Originally 10/25/1998)  . PNA vac Low Risk Adult (2 of 2 - PCV13) 07/21/2019 (Originally 10/26/1999)  . INFLUENZA VACCINE  05/19/2018   Fall Risk  06/22/2017  Falls in the past year? No      Vitals:   01/25/18 0902  BP: 138/68  Pulse: 78  Resp: 18  Temp: 98.6 F (37 C)  TempSrc: Oral  SpO2: 95%  Weight: 145 lb (65.8 kg)  Height: _0  (1.676 m)   Body mass index is 23.4 kg/m.  Physical Exam  GENERAL APPEARANCE: Well nourished. In no acute distress. Normal body habitus SKIN:  Skin is warm and dry.  MOUTH and THROAT: Lips are without lesions. Oral mucosa is moist and without lesions. Tongue is normal in shape, size, and color and without lesions RESPIRATORY: Breathing is even & unlabored, BS CTAB CARDIAC: RRR, no murmur,no extra heart sounds, no edema GI: Abdomen soft, normal BS, no masses, no tenderness EXTREMITIES:  Able to move X 4  extremities PSYCHIATRIC: Alert and oriented X 3. Affect and behavior are appropriate  Labs reviewed: Recent Labs    08/17/17 10/26/17 01/21/18 0914  NA 138 139 136  K 3.9 4.0 3.6  CL  --   --  103  CO2  --   --  26  GLUCOSE  --   --  92  BUN _1 CREATININE 0.7 0.9 1.11*  CALCIUM  --   --  8.7*   Recent Labs    07/22/17 08/17/17  AST 17 22  ALT 11 12  ALKPHOS 45 46   Recent Labs    04/22/17 07/22/17 08/17/17 01/21/18 0914  WBC 8.2 6.5 8.8 6.3  NEUTROABS _2 --   HGB 12.1 12.4 11.8* 11.8*  HCT 38 37 35* 37.3  MCV  --   --   --  103.6*  PLT 356 260 292 299    Lab Results  Component Value Date   CHOL 146 08/17/2017   HDL 66 08/17/2017   LDLCALC 59 08/17/2017   TRIG 108 08/17/2017    Assessment/Plan  1. 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 Essential hypertension - well-controlled, continue Lisinopril 5 mg daily, Metoprolol tartrate 25 mg BID   3. Rheumatoid arthritis involving multiple sites with positive rheumatoid factor (HCC) - stable, continue Prednisone 5 mg daily and Celebrex 200 mg 1 capsule daily   4. Hyponatremia - continue supplementation with NaCl 1 gm on    5. Recurrent UTI - no recent UTI, continue Trimethprim 100 mg daily   6. OAB (overactive bladder) - was recently started on Tolterodine ER 4 mg 1 capsule daily    Family/ staff Communication: Discussed plan of care with resident.   Labs/tests ordered:  None  Goals of care:   Long-term care.   Durenda Age, NP [facility] [phone] (Monday-Friday 8:00 a.m. - 5:00 p.m.) [phone] (after hours)

## 2018-02-22 ENCOUNTER — Non-Acute Institutional Stay (SKILLED_NURSING_FACILITY): Payer: Medicare Other | Admitting: Adult Health

## 2018-02-22 ENCOUNTER — Encounter: Payer: Self-pay | Admitting: Adult Health

## 2018-02-22 DIAGNOSIS — N390 Urinary tract infection, site not specified: Secondary | ICD-10-CM | POA: Diagnosis not present

## 2018-02-22 DIAGNOSIS — I10 Essential (primary) hypertension: Secondary | ICD-10-CM | POA: Diagnosis not present

## 2018-02-22 DIAGNOSIS — F32A Depression, unspecified: Secondary | ICD-10-CM

## 2018-02-22 DIAGNOSIS — I480 Paroxysmal atrial fibrillation: Secondary | ICD-10-CM | POA: Diagnosis not present

## 2018-02-22 DIAGNOSIS — J310 Chronic rhinitis: Secondary | ICD-10-CM | POA: Diagnosis not present

## 2018-02-22 DIAGNOSIS — E7800 Pure hypercholesterolemia, unspecified: Secondary | ICD-10-CM | POA: Diagnosis not present

## 2018-02-22 DIAGNOSIS — M0579 Rheumatoid arthritis with rheumatoid factor of multiple sites without organ or systems involvement: Secondary | ICD-10-CM | POA: Diagnosis not present

## 2018-02-22 DIAGNOSIS — K5901 Slow transit constipation: Secondary | ICD-10-CM | POA: Diagnosis not present

## 2018-02-22 DIAGNOSIS — F329 Major depressive disorder, single episode, unspecified: Secondary | ICD-10-CM

## 2018-02-22 NOTE — Progress Notes (Signed)
Location:  [place] Room Number: 03/05/1932 Place of Service:  SNF (31) Provider:  Durenda Age, NP  Patient Care Team: Hendricks Limes, MD as PCP - General (Internal Medicine) Medina-Vargas, Senaida Lange, NP as Nurse Practitioner (Internal Medicine)  Extended Emergency Contact Information Primary Emergency Contact: Felecia Shelling [place] of [place] Phone: [phone] Relation: Daughter Secondary Emergency Contact: Dione Booze Address: [place], [place] [address] [address] of [place] Mobile Phone: [phone] Relation: Son  Code Status:  Full Code  Goals of care: Advanced Directive information Advanced Directives 01/21/2018  Does Patient Have a Medical Advance Directive? Yes  Type of Advance Directive [place]  Does patient want to make changes to medical advance directive? No - Patient declined  Copy of [place] in Chart? No - copy requested  Would patient like information on creating a medical advance directive? -     Chief Complaint  Patient presents with  . Medical Management of Chronic Issues    Routine [facility] visit    HPI:  Pt is an 84 y.o. female seen today for medical management of chronic diseases.  She is a long-term care resident of [facility].  She has a PMH of dementia, PAD, HLD, essential HTN, A. Fib, and has a family history of heart disease. She was seen in the room today with husband at bedside. She complained of constipation. She reported having an appointment  With rheumatologist on 5/9. She did not verbalize any pain.    Past Medical History:  Diagnosis Date  . Allergy    Remicade caused urticaria  . Anemia    On iron supplement  . Depression    Paxil  . Heart murmur    History of PFO  . Hypertension   . Peripheral vascular disease ([place])   . Rheumatoid arthritis ([place])   . TIA (transient ischemic attack)    06/2010    Past Surgical History:  Procedure Laterality Date  . AMPUTATION TOE Right    Third right toe  . cataract surgery Bilateral   . Partial amputation finger Right    4th finger partially amputated as a child  . TONSILLECTOMY    . TOOTH EXTRACTION N/A 01/21/2018   Procedure: Closure Right Maxillary oral antral fistula;  Surgeon: Diona Browner, DDS;  Location: [place];  Service: Oral Surgery;  Laterality: N/A;  . TOTAL KNEE ARTHROPLASTY Bilateral     Allergies  Allergen Reactions  . Remicade [Infliximab] Other (See Comments)    PLEASE CLARIFY. Reaction not listed on MAR from nursing home    Outpatient Encounter Medications as of 02/22/2018  Medication Sig  . acetaminophen (TYLENOL) 325 MG tablet Take 650 mg by mouth every 6 (six) hours as needed (pain). Do not exceed 3037m in 24 hour period  . apixaban (ELIQUIS) 5 MG TABS tablet Take 5 mg by mouth 2 (two) times daily.  .Marland Kitchenatorvastatin (LIPITOR) 10 MG tablet Take 10 mg by mouth at bedtime.  . bisacodyl (DULCOLAX) 10 MG suppository Place 10 mg rectally daily as needed for moderate constipation. If not relieved by MOM  . Calcium Carbonate-Vitamin D3 (CALCIUM 600/VITAMIN D) 600-400 MG-UNIT TABS Take 1 tablet by mouth 2 (two) times daily.  . celecoxib (CELEBREX) 200 MG capsule Take 200 mg by mouth daily.   . Cholecalciferol 1000 units tablet Take 1,000 Units by mouth daily.  . ciclopirox (LOPROX) 0.77 %  cream Apply 1 application topically at bedtime.   . Cyanocobalamin (B-12) 1000 MCG/ML KIT Inject 1,000 mcg into the muscle every 30 (thirty) days. On the 14th of the month.  . cyclobenzaprine (FLEXERIL) 5 MG tablet Take 2.5 mg by mouth every 8 (eight) hours as needed (cervical muscle spasms).   . diclofenac sodium (VOLTAREN) 1 % GEL Apply 2 g topically every 6 (six) hours as needed (Joint pain).  Marland Kitchen donepezil (ARICEPT) 5 MG tablet Take 5 mg by mouth at bedtime.  . fluticasone (FLONASE) 50 MCG/ACT nasal spray Place 1 spray into both nostrils 2 (two)  times daily as needed for allergies.   . folic acid (FOLVITE) 1 MG tablet Take 1 mg by mouth daily.   Marland Kitchen lisinopril (PRINIVIL,ZESTRIL) 5 MG tablet Take 5 mg by mouth daily.   Marland Kitchen loperamide (IMODIUM A-D) 2 MG tablet Take 4 mg by mouth as needed for diarrhea or loose stools. Give after 1st loose stool prn  . loratadine-pseudoephedrine (CLARITIN-D 24 HOUR) 10-240 MG 24 hr tablet Take 1 tablet by mouth daily.  . magnesium hydroxide (MILK OF MAGNESIA) 400 MG/5ML suspension Take 30 mLs by mouth daily as needed for mild constipation. If no BM in 3 days.  . Melatonin 5 MG TABS Take 5 mg by mouth at bedtime as needed (insomnia).   . Menthol, Topical Analgesic, (BIOFREEZE) 4 % GEL Apply 1 application topically 2 (two) times daily. Apply to neck  . methotrexate (RHEUMATREX) 15 MG tablet Take 15 mg by mouth once a week. 24m (1 tablet) every Monday. Caution: Chemotherapy. Protect from light.  . metoprolol tartrate (LOPRESSOR) 25 MG tablet Take 25 mg by mouth 2 (two) times daily.  . Multiple Vitamins-Minerals (MULTIVITAMIN WITH MINERALS) tablet Take 1 tablet by mouth daily.  .Marland KitchenNUTRITIONAL SUPPLEMENT LIQD Take 120 mLs by mouth 2 (two) times daily.  . ondansetron (ZOFRAN) 4 MG tablet Take 4 mg by mouth every 12 (twelve) hours as needed for nausea or vomiting.  .Marland KitchenoxyCODONE-acetaminophen (PERCOCET) 5-325 MG tablet Take 1 tablet by mouth every 6 (six) hours as needed.  .Marland KitchenPARoxetine (PAXIL) 40 MG tablet Take 40 mg by mouth every morning.  . predniSONE (DELTASONE) 5 MG tablet Take 5 mg by mouth daily with breakfast.   . sennosides-docusate sodium (SENOKOT-S) 8.6-50 MG tablet Take 2 tablets by mouth 2 (two) times daily as needed for constipation.   . sodium chloride 1 g tablet Take 1 g by mouth once a week. On Sundays  . Sodium Phosphates (RA SALINE ENEMA) 19-7 GM/118ML ENEM Place 1 Dose rectally daily as needed (constipation). If unrelieved by bisacodyl suppository  . tolterodine (DETROL LA) 4 MG 24 hr capsule Take 4  mg by mouth daily.  .Marland Kitchentrimethoprim (TRIMPEX) 100 MG tablet Take 100 mg by mouth daily.   . [DISCONTINUED] amoxicillin-clavulanate (AUGMENTIN) 875-125 MG tablet Take 1 tablet by mouth 2 (two) times daily.   No facility-administered encounter medications on file as of 02/22/2018.     Review of Systems  GENERAL: No change in appetite, no fatigue, no weight changes, no fever, chills or weakness RESPIRATORY: no cough, SOB, DOE, wheezing, hemoptysis CARDIAC: No chest pain, edema or palpitations GI: No abdominal pain, diarrhea, constipation, heart burn, nausea or vomiting GU: Denies dysuria, frequency, hematuria, or discharge PSYCHIATRIC: Denies feelings of depression or anxiety. No report of hallucinations, insomnia, paranoia, or agitation   Immunization History  Administered Date(s) Administered  . Influenza-Unspecified 08/16/2017  . PPD Test 04/20/2017  . Pneumococcal-Unspecified 10/25/1998  .  Tdap 06/29/2017   Pertinent  Health Maintenance Due  Topic Date Due  . DEXA SCAN  05/19/2018 (Originally 10/25/1998)  . PNA vac Low Risk Adult (2 of 2 - PCV13) 07/21/2019 (Originally 10/26/1999)  . INFLUENZA VACCINE  05/19/2018   Fall Risk  06/22/2017  Falls in the past year? No     Vitals:   02/22/18 0855  BP: 100/60  Pulse: 68  Resp: 18  Temp: 98 F (36.7 C)  TempSrc: Oral  SpO2: 90%  Weight: 145 lb (65.8 kg)  Height: 5' 6"  (1.676 m)   Body mass index is 23.4 kg/m.  Physical Exam  GENERAL APPEARANCE: Well nourished. In no acute distress. Normal body habitus SKIN:  Skin is warm and dry.  MOUTH and THROAT: Lips are without lesions. Oral mucosa is moist and without lesions. Tongue is normal in shape, size, and color and without lesions RESPIRATORY: Breathing is even & unlabored, BS CTAB CARDIAC: RRR, no murmur,no extra heart sounds, no edema GI: Abdomen soft, normal BS, no masses, no tenderness EXTREMITIES:  Able to move X 4 extremities, bilateral hand fingers with rheumatoid  deformities PSYCHIATRIC: Alert and oriented X 3. Affect and behavior are appropriate   Labs reviewed: Recent Labs    08/17/17 10/26/17 01/21/18 0914  NA 138 139 136  K 3.9 4.0 3.6  CL  --   --  103  CO2  --   --  26  GLUCOSE  --   --  92  BUN 15 19 17   CREATININE 0.7 0.9 1.11*  CALCIUM  --   --  8.7*   Recent Labs    07/22/17 08/17/17  AST 17 22  ALT 11 12  ALKPHOS 45 46   Recent Labs    04/22/17 07/22/17 08/17/17 01/21/18 0914  WBC 8.2 6.5 8.8 6.3  NEUTROABS 5 4 6   --   HGB 12.1 12.4 11.8* 11.8*  HCT 38 37 35* 37.3  MCV  --   --   --  103.6*  PLT 356 260 292 299    Lab Results  Component Value Date   CHOL 146 08/17/2017   HDL 66 08/17/2017   LDLCALC 59 08/17/2017   TRIG 108 08/17/2017    Assessment/Plan  1. Rheumatoid arthritis involving multiple sites with positive rheumatoid factor (HCC) - stable, continue Diclofenac 1% gel apply 2 gm topically Q 6 hours PRN, Percocet 5-325 mg 1 tab Q 8 hours PRN, Methotrexate 15 mg Q Monday, Prednisone 5 mg daily, Celebrex 200 mg 1 capsule daily, Biofreeze 4% gel topically to neck BID, follows-up with rheumatology   2. Recurrent UTI - no recent UTIs, continue Trimethoprim 100 mg 1 tab daily   3. PAF (paroxysmal atrial fibrillation) (HCC) - rate-control, continue Metoprolol Tartrate 25 mg 1 tab BID and Eliquis 5 mg BID   4. Essential hypertension - BPs are inconsistent, will check BP/HR BID X 1 week, continue Lisinopril 5 mg daily   5. Nonallergic rhinitis - continue Fluticasone 50 mcg 1 spray to each nostril daily, Loratadine-D 24 HR 1 tab daily   6. Pure hypercholesterolemia - continue Atorvastatin 10 mg Q HS Lab Results  Component Value Date   CHOL 146 08/17/2017   HDL 66 08/17/2017   LDLCALC 59 08/17/2017   TRIG 108 08/17/2017     7. Depression, unspecified depression type  - mood is stable, continue Paroxetine 40 mg daily   8. Slow transit constipation -  Will discontinue Senexon-S PRN, start Senna-S  8.6-50 mg  2 tabs Q HS    Family/ staff Communication: Discussed plan of care with resident and husband.   Labs/tests ordered:  None  Goals of care:   Long-term care.   Durenda Age, NP [facility] [phone] (Monday-Friday 8:00 a.m. - 5:00 p.m.) [phone] (after hours)

## 2018-03-08 LAB — BASIC METABOLIC PANEL
BUN: 22 — AB (ref 4–21)
Creatinine: 1 (ref 0.5–1.1)
Glucose: 89
Potassium: 4.2 (ref 3.4–5.3)
SODIUM: 135 — AB (ref 137–147)

## 2018-03-08 LAB — CBC AND DIFFERENTIAL
HEMATOCRIT: 36 (ref 36–46)
HEMOGLOBIN: 12.1 (ref 12.0–16.0)
NEUTROS ABS: 3
Platelets: 251 (ref 150–399)
WBC: 5.4

## 2018-03-08 LAB — HEPATIC FUNCTION PANEL
ALT: 9 (ref 7–35)
AST: 17 (ref 13–35)
Alkaline Phosphatase: 45 (ref 25–125)
BILIRUBIN, TOTAL: 0.3

## 2018-03-09 LAB — VITAMIN B12: Vitamin B-12: 828

## 2018-03-30 ENCOUNTER — Encounter: Payer: Self-pay | Admitting: Adult Health

## 2018-03-30 NOTE — Progress Notes (Signed)
This encounter was created in error - please disregard.

## 2018-03-31 ENCOUNTER — Non-Acute Institutional Stay (SKILLED_NURSING_FACILITY): Payer: Medicare Other | Admitting: Internal Medicine

## 2018-03-31 ENCOUNTER — Encounter: Payer: Self-pay | Admitting: Internal Medicine

## 2018-03-31 DIAGNOSIS — R4189 Other symptoms and signs involving cognitive functions and awareness: Secondary | ICD-10-CM | POA: Diagnosis not present

## 2018-03-31 DIAGNOSIS — F329 Major depressive disorder, single episode, unspecified: Secondary | ICD-10-CM

## 2018-03-31 DIAGNOSIS — I959 Hypotension, unspecified: Secondary | ICD-10-CM | POA: Insufficient documentation

## 2018-03-31 DIAGNOSIS — R29818 Other symptoms and signs involving the nervous system: Secondary | ICD-10-CM

## 2018-03-31 DIAGNOSIS — F32A Depression, unspecified: Secondary | ICD-10-CM

## 2018-03-31 NOTE — Progress Notes (Signed)
NURSING HOME LOCATION:  [facility] ROOM NUMBER:  [id number]  CODE STATUS:  Full Code  PCP:  Pecola Lawless, MD  [address] [place] [address]  This is a nursing facility follow up of chronic medical diagnoses .  Interim medical record and care since last [facility] visit was updated with review of diagnostic studies and change in clinical status since last visit were documented.  HPI: The patient is a permanent resident of facility. She was scheduled to be seen for routine follow-up; but staff also reports symptomatic hypotension. Blood pressure has been  noted as low as 83/48 with symptoms of dizziness going from the bed to the wheelchair. The patient underwent oral surgery 01/21/18 for closure of right maxillary antral fistula. Eliquis was discontinued 3 days prior to the oral surgery and restarted 2 days after the procedure. This had been prescribed for paroxysmal atrial fibrillation. Other medical diagnoses include peripheral arterial disease, rheumatoid arthritis, dyslipidemia, and depression. [facility] is following her.  Review of systems: Husband states that she has intermittent memory issues. She stated that today was Father's Day, actually it is in 3 days. She then gave the date as November 2019.  She describes a "bad cold & clogging in throat". The cough is nonproductive and not associated with any other upper or lower respiratory tract symptoms. Last chest x-ray in Epic  record was in 2010. Her last complaint is constipation. She denies any bleeding dyscrasias.  Constitutional: No fever, significant weight change, fatigue  Eyes: No redness, discharge, pain, vision change ENT/mouth: No nasal congestion,  purulent discharge, earache, change in hearing, sore throat  Cardiovascular: No chest pain, palpitations, paroxysmal nocturnal dyspnea, claudication, edema  Respiratory: No sputum production, hemoptysis, DOE, significant snoring, apnea     Gastrointestinal: No heartburn, dysphagia, abdominal pain, nausea /vomiting, rectal bleeding, melena Genitourinary: No dysuria, hematuria, pyuria, incontinence, nocturia Musculoskeletal: No joint stiffness, joint swelling, weakness, pain Dermatologic: No rash, pruritus, change in appearance of skin Neurologic: No dizziness, headache, syncope, seizures, numbness, tingling Psychiatric: No significant anxiety, depression, insomnia, anorexia Endocrine: No change in hair/skin/nails, excessive thirst, excessive hunger, excessive urination  Hematologic/lymphatic: No significant bruising, lymphadenopathy, abnormal bleeding Allergy/immunology: No itchy/watery eyes, significant sneezing, urticaria, angioedema  Physical exam:  Pertinent or positive findings: She has bilateral ptosis. Eyebrows are essentially absent. Her speech is somewhat slurred and garbled. Anisocoria is present with the left pupil slightly greater than the right. She has a few lower teeth which are coated with plaque. The maxilla is edentulous. There is evidence of the prior oral surgery of the right maxilla. Grade 1 blowing systolic murmur at the left sternal border. Breath sounds are decreased at the bases. Pedal pulses are decreased. She has rheumatoid arthritic changes of the hands and deformities of the toes. She's weak to opposition in all extremities. The left shoulder is deformed.  General appearance: Adequately nourished; no acute distress, increased work of breathing is present.   Lymphatic: No lymphadenopathy about the head, neck, axilla. Eyes: No conjunctival inflammation or lid edema is present. There is no scleral icterus. Ears:  External ear exam shows no significant lesions or deformities.   Nose:  External nasal examination shows no deformity or inflammation. Nasal mucosa are pink and moist without lesions, exudates Oral exam:  Lips and gums are healthy appearing. There is no oropharyngeal erythema or exudate. Neck:  No  thyromegaly, masses, tenderness noted.    Heart:  Normal rate and regular rhythm. S1 and S2  normal without gallop, click, rub .  Lungs:  without wheezes, rhonchi, rales, rubs. Abdomen: Bowel sounds are normal. Abdomen is soft and nontender with no organomegaly, hernias, masses. GU: Deferred  Extremities:  No cyanosis, clubbing, edema  Skin: Warm & dry w/o tenting. No significant rash.  See summary under each active problem in the Problem List with associated updated therapeutic plan

## 2018-04-02 LAB — CBC AND DIFFERENTIAL
HCT: 37 (ref 36–46)
HEMOGLOBIN: 12.3 (ref 12.0–16.0)
Neutrophils Absolute: 4
PLATELETS: 241 (ref 150–399)
WBC: 5.9

## 2018-04-02 LAB — BASIC METABOLIC PANEL
BUN: 30 — AB (ref 4–21)
CREATININE: 1.4 — AB (ref 0.5–1.1)
GLUCOSE: 95
POTASSIUM: 4.3 (ref 3.4–5.3)
Sodium: 133 — AB (ref 137–147)

## 2018-04-03 DIAGNOSIS — R29818 Other symptoms and signs involving the nervous system: Secondary | ICD-10-CM | POA: Insufficient documentation

## 2018-04-03 DIAGNOSIS — R4189 Other symptoms and signs involving cognitive functions and awareness: Secondary | ICD-10-CM

## 2018-04-03 LAB — BASIC METABOLIC PANEL
BUN: 30 — AB (ref 4–21)
Creatinine: 1.5 — AB (ref 0.5–1.1)
GLUCOSE: 162
POTASSIUM: 3.6 (ref 3.4–5.3)
Sodium: 134 — AB (ref 137–147)

## 2018-04-03 LAB — CBC AND DIFFERENTIAL
HEMATOCRIT: 36 (ref 36–46)
Hemoglobin: 12 (ref 12.0–16.0)
Neutrophils Absolute: 4
Platelets: 16 — AB (ref 150–399)
WBC: 4.9

## 2018-04-03 LAB — HEPATIC FUNCTION PANEL
ALT: 11 (ref 7–35)
AST: 16 (ref 13–35)
Alkaline Phosphatase: 41 (ref 25–125)
BILIRUBIN, TOTAL: 0.5

## 2018-04-03 NOTE — Patient Instructions (Signed)
See assessment and plan under each diagnosis in the problem list and acutely for this visit 

## 2018-04-03 NOTE — Assessment & Plan Note (Signed)
D/C ACE-I & monitor BP

## 2018-04-03 NOTE — Assessment & Plan Note (Signed)
Verify SLUMS current

## 2018-04-03 NOTE — Assessment & Plan Note (Signed)
Her daughter feels SSRI is sedating She was asked to discuss with [facility]

## 2018-04-04 ENCOUNTER — Encounter: Payer: Self-pay | Admitting: Adult Health

## 2018-04-04 NOTE — Progress Notes (Signed)
This encounter was created in error - please disregard.

## 2018-04-13 ENCOUNTER — Non-Acute Institutional Stay (SKILLED_NURSING_FACILITY): Payer: Medicare Other | Admitting: Adult Health

## 2018-04-13 ENCOUNTER — Encounter: Payer: Self-pay | Admitting: Adult Health

## 2018-04-13 DIAGNOSIS — K5901 Slow transit constipation: Secondary | ICD-10-CM

## 2018-04-13 DIAGNOSIS — K1379 Other lesions of oral mucosa: Secondary | ICD-10-CM

## 2018-04-13 DIAGNOSIS — N183 Chronic kidney disease, stage 3 unspecified: Secondary | ICD-10-CM

## 2018-04-13 DIAGNOSIS — F329 Major depressive disorder, single episode, unspecified: Secondary | ICD-10-CM | POA: Diagnosis not present

## 2018-04-13 DIAGNOSIS — I480 Paroxysmal atrial fibrillation: Secondary | ICD-10-CM

## 2018-04-13 DIAGNOSIS — R630 Anorexia: Secondary | ICD-10-CM | POA: Diagnosis not present

## 2018-04-13 DIAGNOSIS — F32A Depression, unspecified: Secondary | ICD-10-CM

## 2018-04-13 LAB — BASIC METABOLIC PANEL
BUN: 25 — AB (ref 4–21)
Creatinine: 1.3 — AB (ref 0.5–1.1)
GLUCOSE: 90
POTASSIUM: 3.9 (ref 3.4–5.3)
SODIUM: 133 — AB (ref 137–147)

## 2018-04-13 NOTE — Progress Notes (Signed)
Location:  [facility] Room Number: 944-H Place of Service:  SNF (31) Provider:  Durenda Age, NP  Patient Care Team: Hendricks Limes, MD as PCP - General (Internal Medicine) Medina-Vargas, Senaida Lange, NP as Nurse Practitioner (Internal Medicine)  Extended Emergency Contact Information Primary Emergency Contact: Felecia Shelling [place] of [place] Phone: [phone] Relation: Daughter Secondary Emergency Contact: Dione Booze Address: [place],  [address] [place]itter of [place] Mobile Phone: [phone] Relation: Son  Code Status:  Full Code  Goals of care: Advanced Directive information Advanced Directives 01/21/2018  Does Patient Have a Medical Advance Directive? Yes  Type of Advance Directive [place]  Does patient want to make changes to medical advance directive? No - Patient declined  Copy of [place] in Chart? No - copy requested  Would patient like information on creating a medical advance directive? -    HPI:  Pt is an 84 y.o. female seen today for an acute visit due to chronic kidney disease.  She is a long-term care resident of [facility]. She has a PMH of dementia, PAD, HLD, essential hypertension, atrial fibrillation, CKD, and has a family history of heart disease. Daughter was wanting to know the result of her labs. Latest creatinine was 1.26 and GFR 39.09. Resident complained that her gums burn and that she was constipated. She complained that she feels "pooped" and that she is tired a lot.   Past Medical History:  Diagnosis Date  . Allergy    Remicade caused urticaria  . Anemia    On iron supplement  . Depression    Paxil  . Heart murmur    History of PFO  . Hypertension   . Peripheral vascular disease ([place])   . Rheumatoid arthritis ([place])   . TIA (transient ischemic attack)    06/2010   Past Surgical History:  Procedure  Laterality Date  . AMPUTATION TOE Right    Third right toe  . cataract surgery Bilateral   . Partial amputation finger Right    4th finger partially amputated as a child  . TONSILLECTOMY    . TOOTH EXTRACTION N/A 01/21/2018   Procedure: Closure Right Maxillary oral antral fistula;  Surgeon: Diona Browner, DDS;  Location: [facility];  Service: Oral Surgery;  Laterality: N/A;  . TOTAL KNEE ARTHROPLASTY Bilateral     Allergies  Allergen Reactions  . Remicade [Infliximab] Other (See Comments)    PLEASE CLARIFY. Reaction not listed on MAR from nursing home    Outpatient Encounter Medications as of 04/13/2018  Medication Sig  . acetaminophen (TYLENOL) 325 MG tablet Take 650 mg by mouth every 6 (six) hours as needed (pain). Do not exceed 3068m in 24 hour period  . apixaban (ELIQUIS) 5 MG TABS tablet Take 5 mg by mouth 2 (two) times daily.  .Marland Kitchenatorvastatin (LIPITOR) 10 MG tablet Take 10 mg by mouth at bedtime.  . bisacodyl (DULCOLAX) 10 MG suppository Place 10 mg rectally daily as needed for moderate constipation. If not relieved by MOM  . Calcium Carbonate-Vitamin D3 (CALCIUM 600/VITAMIN D) 600-400 MG-UNIT TABS Take 1 tablet by mouth 2 (two) times daily.  . celecoxib (CELEBREX) 200 MG capsule Take 200 mg by mouth daily.   . Cholecalciferol 1000 units tablet Take 1,000 Units by mouth daily.  . ciclopirox (LOPROX) 0.77 % cream Apply 1 application topically at bedtime.   . Cyanocobalamin (B-12)  1000 MCG/ML KIT Inject 1,000 mcg into the muscle every 30 (thirty) days. On the 14th of the month.  . cyclobenzaprine (FLEXERIL) 5 MG tablet Take 2.5 mg by mouth every 8 (eight) hours as needed (cervical muscle spasms).   . diclofenac sodium (VOLTAREN) 1 % GEL Apply 2 g topically every 6 (six) hours as needed (Joint pain).  Marland Kitchen donepezil (ARICEPT) 5 MG tablet Take 5 mg by mouth at bedtime.  Marland Kitchen escitalopram (LEXAPRO) 10 MG tablet Take 10 mg by mouth at bedtime.  . fluticasone (FLONASE) 50 MCG/ACT nasal spray Place  1 spray into both nostrils 2 (two) times daily as needed for allergies.   . folic acid (FOLVITE) 1 MG tablet Take 1 mg by mouth daily.   Marland Kitchen geriatric multivitamins-minerals (ELDERTONIC/GEVRABON) LIQD Take 15 mLs by mouth 2 (two) times daily.  Marland Kitchen guaiFENesin 200 MG tablet Take 200 mg by mouth every 6 (six) hours as needed for cough or to loosen phlegm.  . loperamide (IMODIUM A-D) 2 MG tablet Take 4 mg by mouth as needed for diarrhea or loose stools. Give after 1st loose stool prn  . magic mouthwash SOLN Take 10 mLs by mouth 4 (four) times daily. Swish and spit x2 weeks  . magnesium hydroxide (MILK OF MAGNESIA) 400 MG/5ML suspension Take 30 mLs by mouth daily as needed for mild constipation. If no BM in 3 days.  . Melatonin 5 MG TABS Take 5 mg by mouth at bedtime as needed (insomnia).   . Menthol, Topical Analgesic, (BIOFREEZE) 4 % GEL Apply 1 application topically 2 (two) times daily. Apply to neck  . methotrexate (RHEUMATREX) 15 MG tablet Take 15 mg by mouth once a week. 2m (1 tablet) every Monday. Caution: Chemotherapy. Protect from light.  . metoprolol tartrate (LOPRESSOR) 25 MG tablet Take 12.5 mg by mouth 2 (two) times daily. Take 1/2 tablet to = 12.5 mg.  Hold for SBP <110 or HR </= 60  . Multiple Vitamins-Minerals (MULTIVITAMIN WITH MINERALS) tablet Take 1 tablet by mouth daily.  .Marland KitchenNUTRITIONAL SUPPLEMENT LIQD Take 120 mLs by mouth 2 (two) times daily. MedPass  . oxybutynin (DITROPAN-XL) 10 MG 24 hr tablet Take 10 mg by mouth at bedtime.  .Marland KitchenoxyCODONE-acetaminophen (PERCOCET) 5-325 MG tablet Take 1 tablet by mouth every 6 (six) hours as needed.  . predniSONE (DELTASONE) 5 MG tablet Take 5 mg by mouth daily with breakfast.   . sennosides-docusate sodium (SENOKOT-S) 8.6-50 MG tablet Take 2 tablets by mouth 2 (two) times daily as needed for constipation.   . sodium chloride 1 g tablet Take 1 g by mouth once a week. On Sundays  . Sodium Phosphates (RA SALINE ENEMA) 19-7 GM/118ML ENEM Place 1 Dose  rectally daily as needed (constipation). If unrelieved by bisacodyl suppository  . traMADol (ULTRAM) 50 MG tablet Take 50 mg by mouth every 8 (eight) hours as needed.  . trimethoprim (TRIMPEX) 100 MG tablet Take 100 mg by mouth daily.   . [DISCONTINUED] lisinopril (PRINIVIL,ZESTRIL) 5 MG tablet Take 5 mg by mouth daily.   . [DISCONTINUED] loratadine-pseudoephedrine (CLARITIN-D 24 HOUR) 10-240 MG 24 hr tablet Take 1 tablet by mouth daily.   No facility-administered encounter medications on file as of 04/13/2018.     Review of Systems  GENERAL: Feels tired and no energy MOUTH and THROAT: Denies oral discomfort, gingival pain or bleeding RESPIRATORY: no cough, SOB, DOE, wheezing, hemoptysis CARDIAC: No chest pain, edema or palpitations GI: No abdominal pain, diarrhea, heart burn, nausea or vomiting, +constipated  GU: Denies dysuria, frequency, hematuria, incontinence, or discharge PSYCHIATRIC: Denies feelings of depression or anxiety. No report of hallucinations, insomnia, paranoia, or agitation   Immunization History  Administered Date(s) Administered  . Influenza-Unspecified 08/16/2017  . PPD Test 04/20/2017  . Pneumococcal-Unspecified 10/25/1998  . Tdap 06/29/2017   Pertinent  Health Maintenance Due  Topic Date Due  . DEXA SCAN  05/19/2018 (Originally 10/25/1998)  . PNA vac Low Risk Adult (2 of 2 - PCV13) 07/21/2019 (Originally 10/26/1999)  . INFLUENZA VACCINE  05/19/2018   Fall Risk  06/22/2017  Falls in the past year? No   Body mass index is 22.89 kg/m.  Physical Exam  GENERAL APPEARANCE: Well nourished. In no acute distress. Normal body habitus SKIN:  Skin is warm and dry.  MOUTH and THROAT: Lips are without lesions. Oral mucosa is moist and without lesions. Tongue is normal in shape, size, and color and without lesions RESPIRATORY: Breathing is even & unlabored, BS CTAB CARDIAC: RRR, no murmur,no extra heart sounds, no edema GI: Abdomen soft, normal BS, no masses, no  tenderness EXTREMITIES:  Able to move X 4 extremities PSYCHIATRIC: Alert and oriented X 3. Affect and behavior are appropriate  Labs reviewed: Recent Labs    01/21/18 0914  04/02/18 04/03/18 04/13/18  NA 136   < > 133* 134* 133*  K 3.6   < > 4.3 3.6 3.9  CL 103  --   --   --   --   CO2 26  --   --   --   --   GLUCOSE 92  --   --   --   --   BUN 17   < > 30* 30* 25*  CREATININE 1.11*   < > 1.4* 1.5* 1.3*  CALCIUM 8.7*  --   --   --   --    < > = values in this interval not displayed.   Recent Labs    08/17/17 03/08/18 04/03/18  AST _0 ALT _1 ALKPHOS 46 45 41   Recent Labs    01/21/18 0914 03/08/18 04/02/18 04/03/18  WBC 6.3 5.4 5.9 4.9  NEUTROABS  --  _2 HGB 11.8* 12.1 12.3 12.0  HCT 37.3 36 37 36  MCV 103.6*  --   --   --   PLT 299 251 241 16*    Lab Results  Component Value Date   CHOL 146 08/17/2017   HDL 66 08/17/2017   LDLCALC 59 08/17/2017   TRIG 108 08/17/2017    Assessment/Plan  1. Chronic kidney disease, stage 3 (HCC) - creatinine elevated to 1.51 and now down to 1.26,  and GFR was down to 32 and now  39.09, will refer to nephrology, daughter does not recall her being diagnosed with CKD and would like to consult a nephrologist Lab Results  Component Value Date   CREATININE 1.3 (A) 04/13/2018     2. Oral pain - will start Magic mouthwash 10 ml swish and spit QID X 2 weeks   3. Slow transit constipation - continue Senna- S 8.6-50 mg 2 tabs BID and start Miralax 17 gm daily   4. Depression, unspecified depression type - continue Lexapro 10 mg Q HS, follows up with psych NP   5. PAF (paroxysmal atrial fibrillation) (HCC) - rate-controlled, Metoprolol was recently decreased to 12.5 mg BID  6. Poor appetite - start Eldertonic 15 ml  BID, Body mass index is 22.89 kg/m.  Family/ staff Communication: Discussed plan of care with resident, husband and daughter.  Labs/tests ordered: BMP in 1 month  Goals of care:   Long-term  care   Durenda Age, NP [facility] [phone] (Monday-Friday 8:00 a.m. - 5:00 p.m.) [phone] (after hours)

## 2018-04-15 ENCOUNTER — Other Ambulatory Visit: Payer: Self-pay

## 2018-04-15 MED ORDER — TRAMADOL HCL 50 MG PO TABS: 50.0000 mg | ORAL_TABLET | Freq: Three times a day (TID) | ORAL | 0 refills | Status: AC | PRN

## 2018-04-15 NOTE — Telephone Encounter (Signed)
Monina Medina-Vargas, NP wrote script and gave to nurse. 

## 2018-05-09 LAB — BASIC METABOLIC PANEL
BUN: 17 (ref 4–21)
Creatinine: 1 (ref 0.5–1.1)
GLUCOSE: 86
Potassium: 3.5 (ref 3.4–5.3)
Sodium: 137 (ref 137–147)

## 2018-06-29 LAB — CBC AND DIFFERENTIAL
HEMATOCRIT: 35 — AB (ref 36–46)
HEMOGLOBIN: 11.3 — AB (ref 12.0–16.0)
Neutrophils Absolute: 4
PLATELETS: 224 (ref 150–399)
WBC: 6.5

## 2018-06-30 ENCOUNTER — Encounter: Payer: Self-pay | Admitting: Internal Medicine

## 2018-06-30 ENCOUNTER — Non-Acute Institutional Stay (SKILLED_NURSING_FACILITY): Payer: Medicare Other | Admitting: Internal Medicine

## 2018-06-30 DIAGNOSIS — I480 Paroxysmal atrial fibrillation: Secondary | ICD-10-CM

## 2018-06-30 DIAGNOSIS — R040 Epistaxis: Secondary | ICD-10-CM | POA: Diagnosis not present

## 2018-06-30 DIAGNOSIS — D539 Nutritional anemia, unspecified: Secondary | ICD-10-CM | POA: Diagnosis not present

## 2018-06-30 NOTE — Assessment & Plan Note (Addendum)
Eucerin  twice a day to nasal septum to prevent drying & inflammation which can cause nose bleeds; intranasal steroids can aggravate the situation. D/C Flonase; discussed with daughter.

## 2018-06-30 NOTE — Assessment & Plan Note (Signed)
Resume Eliquis 

## 2018-06-30 NOTE — Assessment & Plan Note (Signed)
No change indicated 

## 2018-06-30 NOTE — Progress Notes (Signed)
NURSING HOME LOCATION:  [facility] ROOM NUMBER:  [id number]  CODE STATUS:  Full Code  PCP:  Pecola Lawless, MD  [address] [place] [address]  This is a nursing facility follow up of chronic medical diagnoses.   Interim medical record and care since last [facility] visit was updated with review of diagnostic studies and change in clinical status since last visit were documented.  HPI: The patient experienced epistaxis from the left nare yesterday 9/11. [facility] NP saw the patient and we discussed the evaluation and treatment.  Intranasal steroids were stopped and her Eliquis was held.  CBC was ordered which reveals hemoglobin 11.3 and hematocrit 35.2 with minimal macrocytosis (MCV 96.8).  Hemoglobin was 12 hematocrit 36 on 04/03/2018.  On that date platelet count was listed to 16,000.  Present platelet count is 224.  Despite the macrocytosis, B12 level has been normal.  The most recent value was 828 on 03/09/2018.  She is on a folic acid supplement. When last seen in June she was having symptomatic hypotension, adjustments were made in her antihypertensive medications.  Specifically the ACE inhibitor was discontinued.    Diagnoses include PAF, peripheral arterial disease,RA, dyslipidemia, and depression.  Psych is following her.  Review of systems: She states that the nosebleeds have stopped.  She has no other bleeding dyscrasias.  She does describe chronic constipation. Constitutional: No fever, significant weight change, fatigue  Eyes: No redness, discharge, pain, vision change ENT/mouth: No nasal congestion,  purulent discharge, earache, change in hearing, sore throat  Cardiovascular: No chest pain, palpitations, paroxysmal nocturnal dyspnea, claudication, edema  Respiratory: No cough, sputum production, hemoptysis, DOE, significant snoring, apnea   Gastrointestinal: No heartburn, dysphagia, abdominal pain, nausea /vomiting, rectal bleeding, melena Genitourinary: No  dysuria, hematuria, pyuria, incontinence, nocturia Musculoskeletal: No joint stiffness, joint swelling, weakness, pain Dermatologic: No rash, pruritus, change in appearance of skin Neurologic: No dizziness, headache, syncope, seizures, numbness, tingling Psychiatric: No significant anxiety, depression, insomnia, anorexia Endocrine: No change in hair/skin/nails, excessive thirst, excessive hunger, excessive urination  Hematologic/lymphatic: No significant bruising, lymphadenopathy,other abnormal bleeding Allergy/immunology: No itchy/watery eyes, significant sneezing, urticaria, angioedema  Physical exam:  Pertinent or positive findings: Ptosis is asymmetric, most notable of the right eye.  She is edentulous but wearing only the upper plate.  The nares are dry without active bleeding.  The left nasolabial fold is decreased.  Heart rhythm is irregular.  She has dry rales, mainly in the bases.  Abdomen is protuberant.  Pedal pulses are decreased.  Her upper extremities are weaker than the lower extremities.  She has severe deformities of rheumatoid arthritis in the hands.Partial amputation 4th R finger & amputation 3rd R toe.  General appearance: Adequately nourished; no acute distress, increased work of breathing is present.   Lymphatic: No lymphadenopathy about the head, neck, axilla. Eyes: No conjunctival inflammation or lid edema is present. There is no scleral icterus. Ears:  External ear exam shows no significant lesions or deformities.   Nose:  External nasal examination shows no deformity or inflammation. Nasal mucosa are pink and moist without lesions, exudates Oral exam:  Lips and gums are healthy appearing. There is no oropharyngeal erythema or exudate. Neck:  No thyromegaly, masses, tenderness noted.    Heart:  No gallop, murmur, click, rub .  Lungs:  without wheezes, rhonchi,rubs. Abdomen: Bowel sounds are normal. Abdomen is soft and nontender with no organomegaly, hernias, masses. GU:  Deferred  Extremities:  No cyanosis, clubbing, edema  Neurologic exam : Balance, Rhomberg, finger to nose testing could not be completed due to clinical state Skin: Warm & dry w/o tenting. No significant lesions or rash.  See summary under each active problem in the Problem List with associated updated therapeutic plan

## 2018-06-30 NOTE — Patient Instructions (Signed)
See assessment and plan under each diagnosis in the problem list and acutely for this visit 

## 2018-07-01 ENCOUNTER — Encounter: Payer: Self-pay | Admitting: Internal Medicine

## 2018-07-14 ENCOUNTER — Other Ambulatory Visit: Payer: Self-pay | Admitting: Internal Medicine

## 2018-07-14 ENCOUNTER — Non-Acute Institutional Stay (SKILLED_NURSING_FACILITY): Payer: Medicare Other

## 2018-07-14 DIAGNOSIS — Z0000 Encounter for general adult medical examination without abnormal findings: Secondary | ICD-10-CM

## 2018-07-14 NOTE — Progress Notes (Addendum)
Subjective:   Cassandra Newton is a 84 y.o. female who presents for Medicare Annual (Subsequent) preventive examination at [facility]- 06/22/2017    Objective:     Vitals: BP 118/60 (BP Location: Left Arm, Patient Position: Supine)   Pulse 73   Temp 98.3 F (36.8 C) (Oral)   Ht _0  (1.676 m)   Wt 132 lb (59.9 kg)   BMI 21.31 kg/m   Body mass index is 21.31 kg/m.  Advanced Directives 07/14/2018 01/21/2018 06/22/2017  Does Patient Have a Medical Advance Directive? No Yes No  Type of Advance Directive - [place] -  Does patient want to make changes to medical advance directive? - No - Patient declined -  Copy of [place] in Chart? - No - copy requested -  Would patient like information on creating a medical advance directive? No - Patient declined - No - Patient declined    Tobacco Social History   Tobacco Use  Smoking Status Never Smoker  Smokeless Tobacco Never Used  Tobacco Comment   never smoked regularly; long time ago     Counseling given: Not Answered Comment: never smoked regularly; long time ago   Clinical Intake:  Pre-visit preparation completed: No  Pain : 0-10 Pain Score: 6  Pain Type: Chronic pain Pain Location: Hip Pain Orientation: Left Pain Descriptors / Indicators: Aching Pain Onset: More than a month ago Pain Frequency: Constant     Diabetes: No  How often do you need to have someone help you when you read instructions, pamphlets, or other written materials from your doctor or pharmacy?: 4 - Often  Interpreter Needed?: No  Information entered by :: Tyson Dense, RN  Past Medical History:  Diagnosis Date  . Allergy    Remicade caused urticaria  . Anemia    On iron supplement  . Depression    Paxil  . Heart murmur    History of PFO  . Hypertension   . Peripheral vascular disease ([place])   . Rheumatoid arthritis ([place])   . TIA (transient ischemic attack)    06/2010    Past Surgical History:  Procedure Laterality Date  . AMPUTATION TOE Right    Third right toe  . cataract surgery Bilateral   . Partial amputation finger Right    4th finger partially amputated as a child  . TONSILLECTOMY    . TOOTH EXTRACTION N/A 01/21/2018   Procedure: Closure Right Maxillary oral antral fistula;  Surgeon: Diona Browner, DDS;  Location: [facility];  Service: Oral Surgery;  Laterality: N/A;  . TOTAL KNEE ARTHROPLASTY Bilateral    Family History  Problem Relation Age of Onset  . Heart disease Mother   . Stroke Mother   . Heart disease Father   . Heart disease Sister   . Cancer Sister   . Heart disease Brother   . Cancer Brother   . Diabetes Neg Hx    Social History   Socioeconomic History  . Marital status: Married    Spouse name: Not on file  . Number of children: Not on file  . Years of education: Not on file  . Highest education level: Not on file  Occupational History  . Not on file  Social Needs  . Financial resource strain: Not hard at all  . Food insecurity:    Worry: Never true    Inability: Never true  . Transportation needs:    Medical: No  Non-medical: No  Tobacco Use  . Smoking status: Never Smoker  . Smokeless tobacco: Never Used  . Tobacco comment: never smoked regularly; long time ago  Substance and Sexual Activity  . Alcohol use: No  . Drug use: No  . Sexual activity: Not on file  Lifestyle  . Physical activity:    Days per week: 0 days    Minutes per session: 0 min  . Stress: Only a little  Relationships  . Social connections:    Talks on phone: Twice a week    Gets together: Never    Attends religious service: Never    Active member of club or organization: No    Attends meetings of clubs or organizations: Never    Relationship status: Married  Other Topics Concern  . Not on file  Social History Narrative   Both she and her husband are long-term care residents of [facility].  Daughter is very  involved in care.    Outpatient Encounter Medications as of 07/14/2018  Medication Sig  . acetaminophen (TYLENOL) 325 MG tablet Take 650 mg by mouth every 6 (six) hours as needed (pain). Do not exceed 3040m in 24 hour period  . atorvastatin (LIPITOR) 10 MG tablet Take 10 mg by mouth at bedtime.  . bisacodyl (DULCOLAX) 10 MG suppository Place 10 mg rectally daily as needed for moderate constipation. If not relieved by MOM  . Calcium Carbonate-Vitamin D3 (CALCIUM 600/VITAMIN D) 600-400 MG-UNIT TABS Take 1 tablet by mouth 2 (two) times daily.  . celecoxib (CELEBREX) 200 MG capsule Take 200 mg by mouth daily.   . cetirizine (ZYRTEC) 10 MG tablet Take 10 mg by mouth daily. x10 days  . Cholecalciferol 1000 units tablet Take 1,000 Units by mouth daily.  . ciclopirox (LOPROX) 0.77 % cream Apply 1 application topically at bedtime.   . Cyanocobalamin (B-12) 1000 MCG/ML KIT Inject 1,000 mcg into the muscle every 30 (thirty) days. On the 14th of the month.  . cyclobenzaprine (FLEXERIL) 5 MG tablet Take 2.5 mg by mouth every 8 (eight) hours as needed (cervical muscle spasms).   . diclofenac sodium (VOLTAREN) 1 % GEL Apply 2 g topically every 6 (six) hours as needed (Joint pain).  .Marland Kitchendonepezil (ARICEPT) 5 MG tablet Take 5 mg by mouth at bedtime.  .Marland Kitchenescitalopram (LEXAPRO) 10 MG tablet Take 15 mg by mouth at bedtime. Take 1-1/2 tablets to = 15 mg  . folic acid (FOLVITE) 1 MG tablet Take 1 mg by mouth daily.   .Marland Kitchengeriatric multivitamins-minerals (ELDERTONIC/GEVRABON) LIQD Take 15 mLs by mouth 2 (two) times daily.  .Marland KitchenguaiFENesin 200 MG tablet Take 200 mg by mouth every 6 (six) hours as needed for cough or to loosen phlegm.  . loperamide (IMODIUM A-D) 2 MG tablet Take 4 mg by mouth as needed for diarrhea or loose stools. Give after 1st loose stool prn  . magnesium hydroxide (MILK OF MAGNESIA) 400 MG/5ML suspension Take 30 mLs by mouth daily as needed for mild constipation. If no BM in 3 days.  . Menthol, Topical  Analgesic, (BIOFREEZE) 4 % GEL Apply 1 application topically 2 (two) times daily. Apply to neck  . methotrexate (RHEUMATREX) 15 MG tablet Take 15 mg by mouth once a week. 147m(1 tablet) every Monday. Caution: Chemotherapy. Protect from light.  . metoprolol tartrate (LOPRESSOR) 25 MG tablet Take 12.5 mg by mouth 2 (two) times daily. Take 1/2 tablet to = 12.5 mg.  Hold for SBP <110 or  HR </= 60  . Multiple Vitamins-Minerals (MULTIVITAMIN WITH MINERALS) tablet Take 1 tablet by mouth daily.  Marland Kitchen NUTRITIONAL SUPPLEMENT LIQD Take 120 mLs by mouth 2 (two) times daily. Med[employer]  . omeprazole (PRILOSEC) 20 MG capsule Take 20 mg by mouth daily.  Marland Kitchen oxybutynin (DITROPAN-XL) 10 MG 24 hr tablet Take 10 mg by mouth at bedtime.  Marland Kitchen oxyCODONE-acetaminophen (PERCOCET) 5-325 MG tablet Take 1 tablet by mouth every 6 (six) hours as needed.  . polyvinyl alcohol (LIQUIFILM TEARS) 1.4 % ophthalmic solution Place 2 drops into both eyes 2 (two) times daily.  . predniSONE (DELTASONE) 5 MG tablet Take 5 mg by mouth daily with breakfast.   . sennosides-docusate sodium (SENOKOT-S) 8.6-50 MG tablet Take 2 tablets by mouth at bedtime.   . sodium chloride 1 g tablet Take 1 g by mouth once a week. On Sundays  . Sodium Phosphates (RA SALINE ENEMA) 19-7 GM/118ML ENEM Place 1 Dose rectally daily as needed (constipation). If unrelieved by bisacodyl suppository  . traMADol (ULTRAM) 50 MG tablet Take 1 tablet (50 mg total) by mouth every 8 (eight) hours as needed.  . trimethoprim (TRIMPEX) 100 MG tablet Take 100 mg by mouth daily.    No facility-administered encounter medications on file as of 07/14/2018.     Activities of Daily Living In your present state of health, do you have any difficulty performing the following activities: 07/14/2018 01/21/2018  Hearing? Y N  Vision? N N  Difficulty concentrating or making decisions? Y N  Walking or climbing stairs? Y Y  Dressing or bathing? Y Y  Doing errands, shopping? Y -  Conservation officer, nature  and eating ? Y -  Using the Toilet? Y -  In the past six months, have you accidently leaked urine? Y -  Do you have problems with loss of bowel control? Y -  Managing your Medications? Y -  Managing your Finances? Y -  Housekeeping or managing your Housekeeping? Y -  Some recent data might be hidden    Patient Care Team: Hendricks Limes, MD as PCP - General (Internal Medicine) Medina-Vargas, Senaida Lange, NP as Nurse Practitioner (Internal Medicine)    Assessment:   This is a routine wellness examination for [place].  Exercise Activities and Dietary recommendations Current Exercise Habits: The patient does not participate in regular exercise at present, Exercise limited by: orthopedic condition(s)  Goals   None     Fall Risk Fall Risk  07/14/2018 06/22/2017  Falls in the past year? No No   Is the patient's home free of loose throw rugs in walkways, pet beds, electrical cords, etc?   yes      Grab bars in the bathroom? yes      Handrails on the stairs?   yes      Adequate lighting?   yes  Depression Screen PHQ 2/9 Scores 07/14/2018 06/22/2017  PHQ - 2 Score 1 1     Cognitive Function     6CIT Screen 07/14/2018 06/22/2017  What Year? 4 points 0 points  What month? 3 points 0 points  What time? 3 points 0 points  Count back from 20 4 points 0 points  Months in reverse 4 points 2 points  Repeat phrase 10 points 6 points  Total Score 28 8    Immunization History  Administered Date(s) Administered  . Influenza-Unspecified 08/16/2017  . PPD Test 04/20/2017  . Pneumococcal-Unspecified 10/25/1998  . Tdap 06/29/2017    Qualifies for Shingles Vaccine? Not in past  records  Screening Tests Health Maintenance  Topic Date Due  . DEXA SCAN  10/25/1998  . INFLUENZA VACCINE  05/19/2018  . PNA vac Low Risk Adult (2 of 2 - PCV13) 07/21/2019 (Originally 10/26/1999)  . TETANUS/TDAP  06/30/2027    Cancer Screenings: Lung: Low Dose CT Chest recommended if Age 20-80 years, 30  pack-year currently smoking OR have quit w/in 15years. Patient does not qualify. Breast:  Up to date on Mammogram? Yes   Up to date of Bone Density/Dexa? Excluded, patient is unambulatory Colorectal: up to date  Additional Screenings:  Hepatitis C Screening: declined Flu vaccine due: will receive at [facility] due: will receive at [facility]:    I have personally reviewed and addressed the Medicare Annual Wellness questionnaire and have noted the following in the patient's chart:  A. Medical and social history B. Use of alcohol, tobacco or illicit drugs  C. Current medications and supplements D. Functional ability and status E.  Nutritional status F.  Physical activity G. Advance directives H. List of other physicians I.  Hospitalizations, surgeries, and ER visits in previous 12 months J.  [place] to include hearing, vision, cognitive, depression L. Referrals and appointments - none  In addition, I have reviewed and discussed with patient certain preventive protocols, quality metrics, and best practice recommendations. A written personalized care plan for preventive services as well as general preventive health recommendations were provided to patient.  See attached scanned questionnaire for additional information.   Signed,   Tyson Dense, RN Nurse Health Advisor  Patient Concerns: None

## 2018-07-14 NOTE — Patient Instructions (Addendum)
Cassandra Newton , Thank you for taking time to come for your Medicare Wellness Visit. I appreciate your ongoing commitment to your health goals. Please review the following plan we discussed and let me know if I can assist you in the future.   Screening recommendations/referrals: Colonoscopy excluded, over age 82 Mammogram excluded, over age 40 Bone Density excluded, patient is unambulatory Recommended yearly ophthalmology/optometry visit for glaucoma screening and checkup Recommended yearly dental visit for hygiene and checkup  Vaccinations: Influenza vaccine due, will receive at [facility] Pneumococcal vaccine 13 due, ordered Tdap vaccine up to date, due 06/30/2027 Shingles vaccine not in past records    Advanced directives: Need a copy for chart  Conditions/risks identified: none  Next appointment: Dr. Alwyn Ren makes rounds   Preventive Care 65 Years and Older, Female Preventive care refers to lifestyle choices and visits with your health care provider that can promote health and wellness. What does preventive care include?  A yearly physical exam. This is also called an annual well check.  Dental exams once or twice a year.  Routine eye exams. Ask your health care provider how often you should have your eyes checked.  Personal lifestyle choices, including:  Daily care of your teeth and gums.  Regular physical activity.  Eating a healthy diet.  Avoiding tobacco and drug use.  Limiting alcohol use.  Practicing safe sex.  Taking low-dose aspirin every day.  Taking vitamin and mineral supplements as recommended by your health care provider. What happens during an annual well check? The services and screenings done by your health care provider during your annual well check will depend on your age, overall health, lifestyle risk factors, and family history of disease. Counseling  Your health care provider may ask you questions about your:  Alcohol use.  Tobacco  use.  Drug use.  Emotional well-being.  Home and relationship well-being.  Sexual activity.  Eating habits.  History of falls.  Memory and ability to understand (cognition).  Work and work Astronomer.  Reproductive health. Screening  You may have the following tests or measurements:  Height, weight, and BMI.  Blood pressure.  Lipid and cholesterol levels. These may be checked every 5 years, or more frequently if you are over 26 years old.  Skin check.  Lung cancer screening. You may have this screening every year starting at age 59 if you have a 30-pack-year history of smoking and currently smoke or have quit within the past 15 years.  Fecal occult blood test (FOBT) of the stool. You may have this test every year starting at age 32.  Flexible sigmoidoscopy or colonoscopy. You may have a sigmoidoscopy every 5 years or a colonoscopy every 10 years starting at age 13.  Hepatitis C blood test.  Hepatitis B blood test.  Sexually transmitted disease (STD) testing.  Diabetes screening. This is done by checking your blood sugar (glucose) after you have not eaten for a while (fasting). You may have this done every 1-3 years.  Bone density scan. This is done to screen for osteoporosis. You may have this done starting at age 110.  Mammogram. This may be done every 1-2 years. Talk to your health care provider about how often you should have regular mammograms. Talk with your health care provider about your test results, treatment options, and if necessary, the need for more tests. Vaccines  Your health care provider may recommend certain vaccines, such as:  Influenza vaccine. This is recommended every year.  Tetanus, diphtheria, and acellular pertussis (  Tdap, Td) vaccine. You may need a Td booster every 10 years.  Zoster vaccine. You may need this after age 68.  Pneumococcal 13-valent conjugate (PCV13) vaccine. One dose is recommended after age 13.  Pneumococcal  polysaccharide (PPSV23) vaccine. One dose is recommended after age 44. Talk to your health care provider about which screenings and vaccines you need and how often you need them. This information is not intended to replace advice given to you by your health care provider. Make sure you discuss any questions you have with your health care provider. Document Released: 11/01/2015 Document Revised: 06/24/2016 Document Reviewed: 08/06/2015 Elsevier Interactive Patient Education  2017 [employer] Prevention in the Home Falls can cause injuries. They can happen to people of all ages. There are many things you can do to make your home safe and to help prevent falls. What can I do on the outside of my home?  Regularly fix the edges of walkways and driveways and fix any cracks.  Remove anything that might make you trip as you walk through a door, such as a raised step or threshold.  Trim any bushes or trees on the path to your home.  Use bright outdoor lighting.  Clear any walking paths of anything that might make someone trip, such as rocks or tools.  Regularly check to see if handrails are loose or broken. Make sure that both sides of any steps have handrails.  Any raised decks and porches should have guardrails on the edges.  Have any leaves, snow, or ice cleared regularly.  Use sand or salt on walking paths during winter.  Clean up any spills in your garage right away. This includes oil or grease spills. What can I do in the bathroom?  Use night lights.  Install grab bars by the toilet and in the tub and shower. Do not use towel bars as grab bars.  Use non-skid mats or decals in the tub or shower.  If you need to sit down in the shower, use a plastic, non-slip stool.  Keep the floor dry. Clean up any water that spills on the floor as soon as it happens.  Remove soap buildup in the tub or shower regularly.  Attach bath mats securely with double-sided non-slip rug  tape.  Do not have throw rugs and other things on the floor that can make you trip. What can I do in the bedroom?  Use night lights.  Make sure that you have a light by your bed that is easy to reach.  Do not use any sheets or blankets that are too big for your bed. They should not hang down onto the floor.  Have a firm chair that has side arms. You can use this for support while you get dressed.  Do not have throw rugs and other things on the floor that can make you trip. What can I do in the kitchen?  Clean up any spills right away.  Avoid walking on wet floors.  Keep items that you use a lot in easy-to-reach places.  If you need to reach something above you, use a strong step stool that has a grab bar.  Keep electrical cords out of the way.  Do not use floor polish or wax that makes floors slippery. If you must use wax, use non-skid floor wax.  Do not have throw rugs and other things on the floor that can make you trip. What can I do with my stairs?  Do  not leave any items on the stairs.  Make sure that there are handrails on both sides of the stairs and use them. Fix handrails that are broken or loose. Make sure that handrails are as long as the stairways.  Check any carpeting to make sure that it is firmly attached to the stairs. Fix any carpet that is loose or worn.  Avoid having throw rugs at the top or bottom of the stairs. If you do have throw rugs, attach them to the floor with carpet tape.  Make sure that you have a light switch at the top of the stairs and the bottom of the stairs. If you do not have them, ask someone to add them for you. What else can I do to help prevent falls?  Wear shoes that:  Do not have high heels.  Have rubber bottoms.  Are comfortable and fit you well.  Are closed at the toe. Do not wear sandals.  If you use a stepladder:  Make sure that it is fully opened. Do not climb a closed stepladder.  Make sure that both sides of the  stepladder are locked into place.  Ask someone to hold it for you, if possible.  Clearly mark and make sure that you can see:  Any grab bars or handrails.  First and last steps.  Where the edge of each step is.  Use tools that help you move around (mobility aids) if they are needed. These include:  Canes.  Walkers.  Scooters.  Crutches.  Turn on the lights when you go into a dark area. Replace any light bulbs as soon as they burn out.  Set up your furniture so you have a clear path. Avoid moving your furniture around.  If any of your floors are uneven, fix them.  If there are any pets around you, be aware of where they are.  Review your medicines with your doctor. Some medicines can make you feel dizzy. This can increase your chance of falling. Ask your doctor what other things that you can do to help prevent falls. This information is not intended to replace advice given to you by your health care provider. Make sure you discuss any questions you have with your health care provider. Document Released: 08/01/2009 Document Revised: 03/12/2016 Document Reviewed: 11/09/2014 Elsevier Interactive Patient Education  2017 [employer].

## 2018-07-21 ENCOUNTER — Encounter: Payer: Self-pay | Admitting: Internal Medicine

## 2018-07-21 ENCOUNTER — Non-Acute Institutional Stay (SKILLED_NURSING_FACILITY): Payer: Medicare Other | Admitting: Internal Medicine

## 2018-07-21 DIAGNOSIS — M3500 Sicca syndrome, unspecified: Secondary | ICD-10-CM

## 2018-07-21 DIAGNOSIS — Z789 Other specified health status: Secondary | ICD-10-CM

## 2018-07-21 DIAGNOSIS — I10 Essential (primary) hypertension: Secondary | ICD-10-CM

## 2018-07-21 DIAGNOSIS — R5383 Other fatigue: Secondary | ICD-10-CM | POA: Diagnosis not present

## 2018-07-21 DIAGNOSIS — R4189 Other symptoms and signs involving cognitive functions and awareness: Secondary | ICD-10-CM

## 2018-07-21 DIAGNOSIS — R29818 Other symptoms and signs involving the nervous system: Secondary | ICD-10-CM | POA: Diagnosis not present

## 2018-07-21 NOTE — Assessment & Plan Note (Signed)
Patient determined to have capacity to make decisions concerning CODE STATUS Continue Aricept

## 2018-07-21 NOTE — Patient Instructions (Signed)
See assessment and plan under each diagnosis in the problem list and acutely for this visit 

## 2018-07-21 NOTE — Progress Notes (Signed)
NURSING HOME LOCATION:  [facility] ROOM NUMBER:  227-A  CODE STATUS:  Full Code  PCP:  Pecola Lawless, MD  [address] [place] [address]  This is a nursing facility follow up for specific acute issues of mental status/ capacity assessment and current CODE STATUS.  Interim medical record and care since last [facility] visit was updated with review of diagnostic studies and change in clinical status since last visit were documented.  HPI: The patient has declared herself to be a full code.  She is on Aricept 5 mg daily. Screening of mental status with the 6 CIT test  07/14/18 suggested dramatic deterioration in neurocognitive function.  Specifically the score had changed from 8 out of 28 to 28 out of 28, the latter the lowest possible score.BIMS screening revealed a perfect score of 15 out of 15.  Because of this discrepancy among the screening tests the more formal SLUMS test was performed 07/20/2018.  This revealed a score of 25 out of 30 compatible with minimal neurocognitive deficit.  She has a high school education and states that she stayed home to "raise 6 kids". Once capacity was confirmed, CODE STATUS was discussed.  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  I explained that her risk of surviving chest compression was essentially nil.  I explained that even w/o CPR interventions could be pursued for reversible processes such as pneumonias, hypo/hypertension or cardiac irregularities.  After the discussion she expressed a desire to have a limited code with no CPR.  Review of systems: She describes chronic fatigue.  She also describes throat congestion and intermittent cough.  Guaifenesin 200 mg every 6 hours as needed has been prescribed for cough and phlegm.She is receiving eyedrops for dry eyes.  She describes intermittent ear pain.  She also states that her lips are intermittently  puffy.  She is not on an ACE or ARB.  She is on Zyrtec. Speech therapy is evaluating swallowing function.  She denies any definite dysphagia.  Constitutional: No fever, significant weight change Eyes: No redness, discharge, pain, vision change ENT/mouth: No nasal congestion,  purulent discharge, change in hearing, sore throat  Cardiovascular: No chest pain, palpitations, paroxysmal nocturnal dyspnea, claudication, edema  Respiratory: No  hemoptysis, significant snoring, apnea   Gastrointestinal: No heartburn, abdominal pain, nausea /vomiting, rectal bleeding, melena, change in bowels Genitourinary: No dysuria, hematuria, pyuria, incontinence, nocturia Dermatologic: No rash, pruritus, change in appearance of skin Neurologic: No dizziness, headache, syncope, seizures, numbness, tingling Endocrine: No change in hair/skin/nails, excessive thirst, excessive hunger, excessive urination  Hematologic/lymphatic: No significant bruising, lymphadenopathy, abnormal bleeding Allergy/immunology: No itchy/watery eyes, significant sneezing, urticaria  Physical exam:  Pertinent or positive findings: She is frail and appears chronically ill.  There is minimal spontaneous musculo skeletal activity.  Left nasolabial fold is decreased.  She is edentulous.  There is ptosis greater on the left than the right.  She has thick secretions in the posterior pharynx.  The mouth is dry.  Heart sounds are distant and irregular.  She has low-grade rales/rhonchi bilaterally somewhat greater on the right than the left.  Pedal pulses are decreased especially posterior tibial pulses.  She has severe hand deformities and interosseous wasting. Partial amputation 4th R finger.  She is generally weak, most notable in the right upper extremity.  There is decreased range of motion and elevation of the right upper extremity related to previous  shoulder issues.  General appearance:  no acute distress, increased work of breathing is present.    Lymphatic: No lymphadenopathy about the head, neck, axilla. Eyes: No conjunctival inflammation or lid edema is present. There is no scleral icterus. Ears:  External ear exam shows no significant lesions or deformities.   Nose:  External nasal examination shows no deformity or inflammation.  Oral exam:  Lips and gums are healthy appearing. There is no oropharyngeal erythema. Neck:  No thyromegaly, masses, tenderness noted.    Heart:  No murmur, click, rub .  Lungs:  without wheezes, rubs. Abdomen: Bowel sounds are normal. Abdomen is soft and nontender with no organomegaly, hernias, masses. GU: Deferred  Extremities:  No cyanosis, clubbing, edema  Neurologic exam : Balance, Rhomberg, finger to nose testing could not be completed due to clinical state Skin: Warm & dry . No significant lesions or rash.  See summary under each active problem in the Problem List with associated updated therapeutic plan

## 2018-07-23 ENCOUNTER — Encounter: Payer: Self-pay | Admitting: Internal Medicine

## 2018-07-23 DIAGNOSIS — M3500 Sicca syndrome, unspecified: Secondary | ICD-10-CM | POA: Insufficient documentation

## 2018-07-23 DIAGNOSIS — R5383 Other fatigue: Secondary | ICD-10-CM | POA: Insufficient documentation

## 2018-07-23 NOTE — Assessment & Plan Note (Signed)
Hold Beta blocker & monitor BP & pulse

## 2018-07-23 NOTE — Assessment & Plan Note (Signed)
Recheck CBC & update TSH

## 2018-07-23 NOTE — Assessment & Plan Note (Signed)
Continue ophthalmologic liquifying agent Mucinex tid pc & increased oral fluids

## 2018-07-27 LAB — TSH: TSH: 0.6 (ref 0.41–5.90)

## 2018-07-27 LAB — IRON,TIBC AND FERRITIN PANEL
%SAT: 38.02
Iron: 100
TIBC: 264
UIBC: 164

## 2018-07-27 LAB — CBC AND DIFFERENTIAL
HCT: 31 — AB (ref 36–46)
HEMOGLOBIN: 10 — AB (ref 12.0–16.0)
NEUTROS ABS: 3
Platelets: 252 (ref 150–399)
WBC: 4.8

## 2018-07-27 LAB — VITAMIN B12: VITAMIN B 12: 862

## 2018-07-29 LAB — BASIC METABOLIC PANEL
BUN: 20 (ref 4–21)
CREATININE: 1.1 (ref 0.5–1.1)
Glucose: 87
POTASSIUM: 3.9 (ref 3.4–5.3)
SODIUM: 137 (ref 137–147)

## 2018-08-23 LAB — IRON,TIBC AND FERRITIN PANEL
%SAT: 12.68
Ferritin: 62.75
Iron: 29
TIBC: 226
UIBC: 197

## 2018-08-23 LAB — VITAMIN B12: Vitamin B-12: 635

## 2018-11-10 ENCOUNTER — Encounter: Payer: Self-pay | Admitting: Internal Medicine

## 2018-11-10 ENCOUNTER — Non-Acute Institutional Stay (SKILLED_NURSING_FACILITY): Payer: Medicare Other | Admitting: Internal Medicine

## 2018-11-10 DIAGNOSIS — H9202 Otalgia, left ear: Secondary | ICD-10-CM | POA: Diagnosis not present

## 2018-11-10 NOTE — Progress Notes (Signed)
    NURSING HOME LOCATION:  [facility] ROOM NUMBER:  227-A  CODE STATUS:  DNR  PCP:  Pecola Lawless, MD  [address] [place] [address]  This was to be a nursing facility follow up of chronic medical diagnoses; but she describes acute pain in the left ear over the past 24 hours.  HPI: [facility] NP evaluated the patient and started Debrox for increased cerumen.  The patient states that although the left ear pain has only been present 1 day, she has had a sore throat and throat congestion for several days.  She denies any associated upper respiratory tract infection symptoms or purulent sputum.  Denies any prior history of otolaryngologic issues.  Review of systems:  Constitutional: No fever, significant weight change, fatigue  Eyes: No redness, discharge, pain, vision change ENT/mouth: No nasal congestion,  purulent discharge,  change in hearing  Cardiovascular: No chest pain, palpitations, paroxysmal nocturnal dyspnea, claudication, edema  Respiratory: No cough, sputum production, hemoptysis Dermatologic: No rash, pruritus, change in appearance of skin Hematologic/lymphatic: No significant bruising, lymphadenopathy, abnormal bleeding Allergy/immunology: No itchy/watery eyes, significant sneezing, urticaria, angioedema  Physical exam:  Pertinent or positive findings: She is in the lounge chair exhibiting minimal spontaneous activity.  She is completely edentulous.  There is asymmetry of the nasolabial folds.  There is dramatic collection of cerumen on the right.  Large amount of wax was removed with a small spatula.  The TM was partially visualized after wax removal and appeared dull without inflammation.  There is minimal cerumen on the left.  The partially visualizedTM also appears dull without inflammation. No pain @ TMJ with mastication maneuvers or palpation.  She has no associated cervical lymphadenopathy.  She has diffuse low-grade dry rales diffusely partially obscuring heart sounds.   Heart is heard best @ lower left sternal border.  Heart rate is slow.  She has dramatic rheumatoid changes of the hands.  The left hand /forearm is in a brace.  She has essentially no strength in the lower extremities.  Pedal pulses are decreased  General appearance:  no acute distress, increased work of breathing is present.   Lymphatic: No lymphadenopathy about the head, neck, axilla. Eyes: No conjunctival inflammation or lid edema is present. There is no scleral icterus. Ears:  External ear exam shows no significant lesions or deformities.   Nose:  External nasal examination shows no deformity or inflammation. Nasal mucosa are dry without lesions, exudates Oral exam:  Lips and gums are healthy appearing. There is no oropharyngeal erythema or exudate. Neck:  No thyromegaly, masses, tenderness noted.    Heart:  No gallop, murmur, click, rub .  Abdomen: Bowel sounds are normal. Abdomen is soft and nontender with no organomegaly, hernias, masses. GU: Deferred  Extremities:  No cyanosis, clubbing, edema  Skin: Warm & dry w/o tenting. No significant lesions or rash.  See summary under each active problem in the Problem List with associated updated therapeutic plan

## 2018-11-10 NOTE — Patient Instructions (Signed)
See assessment and plan under each diagnosis in the problem list and acutely for this visit 

## 2018-11-12 ENCOUNTER — Encounter: Payer: Self-pay | Admitting: Internal Medicine

## 2018-12-23 LAB — HEPATIC FUNCTION PANEL
ALT: 14 (ref 7–35)
AST: 22 (ref 13–35)

## 2018-12-23 LAB — CBC AND DIFFERENTIAL
HCT: 28 — AB (ref 36–46)
Hemoglobin: 9.1 — AB (ref 12.0–16.0)
PLATELETS: 257 (ref 150–399)
WBC: 2.6

## 2018-12-23 LAB — BASIC METABOLIC PANEL
BUN: 16 (ref 4–21)
CREATININE: 0.7 (ref 0.5–1.1)
GLUCOSE: 86
POTASSIUM: 4.1 (ref 3.4–5.3)
Sodium: 142 (ref 137–147)

## 2018-12-27 LAB — VITAMIN D 25 HYDROXY (VIT D DEFICIENCY, FRACTURES): VIT D 25 HYDROXY: 30.81

## 2019-01-24 ENCOUNTER — Encounter: Payer: Self-pay | Admitting: Internal Medicine

## 2019-01-24 ENCOUNTER — Non-Acute Institutional Stay (SKILLED_NURSING_FACILITY): Payer: Medicare Other | Admitting: Internal Medicine

## 2019-01-24 DIAGNOSIS — M3500 Sicca syndrome, unspecified: Secondary | ICD-10-CM | POA: Diagnosis not present

## 2019-01-24 DIAGNOSIS — I480 Paroxysmal atrial fibrillation: Secondary | ICD-10-CM

## 2019-01-24 DIAGNOSIS — I10 Essential (primary) hypertension: Secondary | ICD-10-CM

## 2019-01-24 DIAGNOSIS — F339 Major depressive disorder, recurrent, unspecified: Secondary | ICD-10-CM | POA: Diagnosis not present

## 2019-01-24 NOTE — Progress Notes (Signed)
   NURSING HOME LOCATION:  [facility] ROOM NUMBER: 227/A   CODE STATUS: DNR   PCP:  Pecola Lawless MD.  This is a nursing facility follow up of chronic medical diagnoses  Interim medical record and care since last [facility] visit was updated with review of diagnostic studies and change in clinical status since last visit were documented.  HPI: She is a permanent resident of the facility with medical diagnoses of history of TIA, advanced rheumatoid arthritis, essential hypertension, chronic depression, PVD and chronic anemia.  Review of systems: Appetite has been poor and typically she has stayed in bed rather than going to the dining room for breakfast. Her husband was apparently ordering what he wanted to eat further impacting intake. Nutritional supplementation has been ordered.  She does have some dysphagia in the context of history of Sjogren's syndrome.  She is also waiting for dentures to be manufactured.  She has intermittent constipation.  She describes anxiety and depression "now and then".  Her major symptom is chronic neck pain. Respiratory symptoms have resolved completely.  Constitutional: No fever Eyes: No redness, discharge, pain, vision change ENT/mouth: No nasal congestion,  purulent discharge, earache, change in hearing, sore throat  Cardiovascular: No chest pain, palpitations, paroxysmal nocturnal dyspnea, claudication, edema  Respiratory: No cough, sputum production, hemoptysis, DOE, significant snoring, apnea   Gastrointestinal: No heartburn, abdominal pain, nausea /vomiting, rectal bleeding, melena Genitourinary: No dysuria, hematuria, pyuria, incontinence, nocturia Dermatologic: No rash, pruritus, change in appearance of skin Neurologic: No dizziness, headache, syncope, seizures, numbness, tingling Endocrine: No change in hair/skin/nails, excessive thirst, excessive hunger, excessive urination  Hematologic/lymphatic: No significant bruising,  lymphadenopathy, abnormal bleeding Allergy/immunology: No itchy/watery eyes, significant sneezing, urticaria, angioedema  Physical exam:  Pertinent or positive findings: She is up in the wheelchair; typically she stays in bed most of the time.  Her hair is very fine and thin.  There is slight asymmetry of the nasolabial folds.  She is completely edentulous.  Heart rhythm and rate are slow and irregular.  Flow murmur suggested.  Dry rales are noted diffusely.  Dorsalis pedis pulses are stronger than the posterior tibial pulses.  She is markedly weak to opposition in all extremities.  Partial amputation of the fourth right finger.  She has severe rheumatoid arthritis changes of the hands with lateral deviation and contractures.  General appearance: no acute distress, increased work of breathing is present.   Lymphatic: No lymphadenopathy about the head, neck, axilla. Eyes: No conjunctival inflammation or lid edema is present. There is no scleral icterus. Ears:  External ear exam shows no significant lesions or deformities.   Nose:  External nasal examination shows no deformity or inflammation. Nasal mucosa are pink and moist without lesions, exudates Oral exam:  Lips and gums are healthy appearing. There is no oropharyngeal erythema or exudate. Neck:  No thyromegaly, masses, tenderness noted.    Heart:  No gallop, click, rub .  Lungs: without wheezes, rhonchi, rubs. Abdomen: Bowel sounds are normal. Abdomen is soft and nontender with no organomegaly, hernias, masses. GU: Deferred  Extremities:  No cyanosis, clubbing, edema  Neurologic exam : Balance, Rhomberg, finger to nose testing could not be completed due to clinical state Skin: Warm & dry w/o tenting. No significant lesions or rash.  See summary under each active problem in the Problem List with associated updated therapeutic plan

## 2019-01-24 NOTE — Assessment & Plan Note (Signed)
BP controlled w/o antihypertensive medications  

## 2019-01-25 NOTE — Patient Instructions (Signed)
See assessment and plan under each diagnosis in the problem list and acutely for this visit 

## 2019-05-11 ENCOUNTER — Encounter: Payer: Self-pay | Admitting: Internal Medicine

## 2019-05-11 ENCOUNTER — Non-Acute Institutional Stay (SKILLED_NURSING_FACILITY): Payer: Medicare Other | Admitting: Internal Medicine

## 2019-05-11 DIAGNOSIS — R05 Cough: Secondary | ICD-10-CM

## 2019-05-11 DIAGNOSIS — M0579 Rheumatoid arthritis with rheumatoid factor of multiple sites without organ or systems involvement: Secondary | ICD-10-CM

## 2019-05-11 DIAGNOSIS — I10 Essential (primary) hypertension: Secondary | ICD-10-CM

## 2019-05-11 DIAGNOSIS — I480 Paroxysmal atrial fibrillation: Secondary | ICD-10-CM

## 2019-05-11 DIAGNOSIS — R5383 Other fatigue: Secondary | ICD-10-CM

## 2019-05-11 DIAGNOSIS — R059 Cough, unspecified: Secondary | ICD-10-CM

## 2019-05-11 NOTE — Assessment & Plan Note (Signed)
Stable on present chronic regimen.  Stress dose steroids would be indicated with any acute decompensation which could precipitate adrenal insufficiency.

## 2019-05-11 NOTE — Progress Notes (Signed)
NURSING HOME LOCATION:  [facility] ROOM NUMBER:  227-A  CODE STATUS:  DNR  PCP:  Hendricks Limes, MD  [address] [place] [address]   This is a nursing facility follow up of chronic medical diagnoses.  Interim medical record and care since last [place] visit was updated with review of diagnostic studies and change in clinical status since last visit were documented.  HPI: She is a permanent resident of the facility with diagnoses of debilitating rheumatoid arthritis, PVD, essential hypertension, chronic anemia, TIA, and depression. 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 [facility] NP has weaned and discontinued her narcotic.  She remains on tramadol.  She is also on an SSRI, no symptoms of serotonin syndrome have been reported. She is on chronic low-dose prednisone as well as weekly methotrexate for the RA.  Review of systems: Her major complaint is that she is constantly tired.  TSH was 0.60 in October 2019.  On 12/23/2018 hemoglobin 9.1/hematocrit 28.  She denies any bleeding dyscrasias.   She describes a nonproductive cough after meals. She states that she must clear her throat frequently.  She denies any upper respiratory tract infectious symptoms or allergic symptoms.  She has some frontal headache occasionally.  Her husband states that she vomits at least once a day.  She does describe some dyspepsia as well as dysphagia.  She also describes constipation despite the narcotics being weaned.  Wound Care Nurse reports mild elbow abrasions. Patient describes chronic depression.  Constitutional: No fever, significant weight change  Eyes: No redness, discharge, pain, vision change ENT/mouth: No nasal congestion,  purulent discharge, earache  Cardiovascular: No chest pain, palpitations, paroxysmal nocturnal dyspnea, edema  Respiratory: No sputum production, hemoptysis,  significant snoring, apnea   Gastrointestinal: No heartburn,  abdominal pain, rectal bleeding, melena Genitourinary: No dysuria, hematuria, pyuria Neurologic: No dizziness, syncope, seizures Endocrine: No change in hair/skin/nails, excessive thirst, excessive hunger, excessive urination  Hematologic/lymphatic: No significant bruising, lymphadenopathy, abnormal bleeding  Physical exam:  Pertinent or positive findings: She appears chronically ill and suboptimally nourished.  Her hair is fine and wispy.  She speaks in a voice which cracks.  She is edentulous.  Slight anisocoria is suggested with the left pupil minimally larger than the right.  The left nasolabial fold is decreased.  She has coarse rhonchi and scattered dry rales bilaterally which in essence obscure the heart sounds.  Abdomen is protuberant.  Bowel sounds are hyperactive.  Pedal pulses are decreased.  She is weak to opposition in all extremities.  The lower extremities are stronger than the upper extremities however.  The left upper extremity is stronger than the right.  She has a right shoulder impingement and the right shoulder is lower than the left.  She has severe rheumatoid contractures of her hands.  General appearance: no acute distress, increased work of breathing is present.   Lymphatic: No lymphadenopathy about the head, neck, axilla. Eyes: No conjunctival inflammation or lid edema is present. There is no scleral icterus. Ears:  External ear exam shows no significant lesions or deformities.   Nose:  External nasal examination shows no deformity or inflammation. Nasal mucosa are pink and moist without lesions, exudates Oral exam:  Lips and gums are healthy appearing. There is no oropharyngeal erythema or exudate. Neck:  No thyromegaly, masses, tenderness noted.    Abdomen: Abdomen is soft and nontender with no organomegaly, hernias, masses. GU: Deferred  Extremities:  No cyanosis,  clubbing, edema  Neurologic exam : Balance, Rhomberg, finger to nose testing could not be completed due  to clinical state Skin: Warm & dry  No significant rash.  See summary under each active problem in the Problem List with associated updated therapeutic plan

## 2019-05-11 NOTE — Assessment & Plan Note (Signed)
Update TSH and CBC. 

## 2019-05-11 NOTE — Assessment & Plan Note (Signed)
Adequate rate control; she is on lifelong Eliquis.

## 2019-05-11 NOTE — Assessment & Plan Note (Signed)
Blood pressure remains normal without antihypertensive medication.

## 2019-05-11 NOTE — Patient Instructions (Signed)
See assessment and plan under each diagnosis in the problem list and acutely for this visit 

## 2019-08-17 ENCOUNTER — Encounter: Payer: Self-pay | Admitting: Internal Medicine

## 2019-08-17 ENCOUNTER — Non-Acute Institutional Stay (SKILLED_NURSING_FACILITY): Payer: Medicare Other | Admitting: Internal Medicine

## 2019-08-17 DIAGNOSIS — R627 Adult failure to thrive: Secondary | ICD-10-CM | POA: Diagnosis not present

## 2019-08-17 DIAGNOSIS — M0579 Rheumatoid arthritis with rheumatoid factor of multiple sites without organ or systems involvement: Secondary | ICD-10-CM

## 2019-08-17 NOTE — Patient Instructions (Signed)
See assessment and plan under diagnosis acutely for this visit  

## 2019-08-17 NOTE — Progress Notes (Signed)
NURSING HOME LOCATION:  [facility] ROOM NUMBER: [id number]  CODE STATUS:  DNR  PCP:  Pascal Lux MD/ [facility]    This is a nursing facility follow up of chronic medical diagnoses  Interim medical record and care since last [place] visit was updated with review of diagnostic studies and change in clinical status since last visit were documented.  HPI: She is a permanent resident of facility with advanced crippling RA, PVD, essential hypertension, chronic depression, and history of TIA. She has had toe and finger amputation procedures.  She is also had a TKA. Family history is strongly positive for cancer and heart disease. Labs are not current because of the COVID-19 pandemic.  In March she had significant anemia with hemoglobin 9.1/hematocrit 28.  Review of systems: She denies any active concerns.  According to her husband she will complain of pain in the right shoulder at the site of the previous fracture with UE ROM.  He states that otherwise  she basically "never complains".  He states that she is bedridden and essentially sleeps most of the time. She specifically denies any infectious symptoms of COVID-19, cardiac, pulmonary, or GI symptoms. [facility] NP reports that she continues to have progressive adult failure to thrive with associated weight loss.  [facility] NP's states that the daughter has come to grips with her mother's imminent death.  Constitutional: No fever, significant weight change, fatigue  Eyes: No redness, discharge, pain, vision change ENT/mouth: No nasal congestion,  purulent discharge, earache, change in hearing, sore throat  Cardiovascular: No chest pain, palpitations, paroxysmal nocturnal dyspnea, claudication, edema  Respiratory: No cough, sputum production, hemoptysis, DOE, significant snoring, apnea   Gastrointestinal: No heartburn, dysphagia, abdominal pain, nausea /vomiting, rectal bleeding, melena, change in bowels Genitourinary: No dysuria, hematuria,  pyuria, incontinence, nocturia Dermatologic: No rash, pruritus, change in appearance of skin Neurologic: No dizziness, headache, syncope, seizures, numbness, tingling Psychiatric: No significant anxiety, insomnia, anorexia Endocrine: No change in hair/skin/nails, excessive thirst, excessive hunger, excessive urination  Hematologic/lymphatic: No significant bruising, lymphadenopathy, abnormal bleeding Allergy/immunology: No itchy/watery eyes, significant sneezing, urticaria, angioedema  Physical exam:  Pertinent or positive findings: She indeed was asleep when I entered the room after 12 noon.  She was arousable.  Hair is disheveled.  Voice is weak and cracking.  She has ptosis on the left greater than the right.  She is edentulous.Breath sounds decreased.Decreased pedal pulses present.Severe RA hand changes present. Diffusely weak especially RUE & legs.  General appearance:  no acute distress, increased work of breathing is present.   Lymphatic: No lymphadenopathy about the head, neck, axilla. Eyes: No conjunctival inflammation or lid edema is present. There is no scleral icterus. Ears:  External ear exam shows no significant lesions or deformities.   Nose:  External nasal examination shows no deformity or inflammation. Nasal mucosa are pink and moist without lesions, exudates Oral exam:  Lips and gums are healthy appearing.  Neck:  No thyromegaly, masses, tenderness noted.    Heart:  No gallop, murmur, click, rub .  Lungs:  without wheezes, rhonchi, rales, rubs. Abdomen: Bowel sounds are normal. Abdomen is soft and nontender with no organomegaly, hernias, masses. GU: Deferred  Extremities:  No cyanosis, clubbing, edema  Neurologic exam : Balance, Rhomberg, finger to nose testing could not be completed due to clinical state Skin: Warm & dry w/o tenting. No significant lesions or rash.  See summary under each active problem in the Problem List with associated updated therapeutic plan

## 2019-08-17 NOTE — Assessment & Plan Note (Addendum)
[facility] NP & I will review palliative care plan. Deprescribing will be discussed as well. For example; statin therapy not essential.

## 2019-08-18 NOTE — Assessment & Plan Note (Signed)
Clinically stable on low dose prednisone & methotrexate; no change in therapy

## 2019-09-13 DIAGNOSIS — K5901 Slow transit constipation: Secondary | ICD-10-CM

## 2019-09-13 DIAGNOSIS — G301 Alzheimer's disease with late onset: Secondary | ICD-10-CM | POA: Diagnosis not present

## 2019-09-13 DIAGNOSIS — F0280 Dementia in other diseases classified elsewhere without behavioral disturbance: Secondary | ICD-10-CM | POA: Diagnosis not present

## 2019-09-13 DIAGNOSIS — F339 Major depressive disorder, recurrent, unspecified: Secondary | ICD-10-CM

## 2019-09-13 DIAGNOSIS — M6281 Muscle weakness (generalized): Secondary | ICD-10-CM

## 2019-09-13 DIAGNOSIS — I4891 Unspecified atrial fibrillation: Secondary | ICD-10-CM

## 2019-09-13 DIAGNOSIS — Z9181 History of falling: Secondary | ICD-10-CM

## 2019-09-13 DIAGNOSIS — G4700 Insomnia, unspecified: Secondary | ICD-10-CM

## 2019-10-20 DEATH — deceased
